# Patient Record
Sex: Female | Born: 1945 | Race: White | Hispanic: No | Smoking: Never smoker
Health system: Southern US, Community
[De-identification: ages and names within clinical notes are randomized; demographics above are authoritative.]

## PROBLEM LIST (undated history)

## (undated) DIAGNOSIS — M51369 Other intervertebral disc degeneration, lumbar region without mention of lumbar back pain or lower extremity pain: Secondary | ICD-10-CM

## (undated) DIAGNOSIS — M5136 Other intervertebral disc degeneration, lumbar region: Secondary | ICD-10-CM

## (undated) DIAGNOSIS — E785 Hyperlipidemia, unspecified: Secondary | ICD-10-CM

## (undated) DIAGNOSIS — I10 Essential (primary) hypertension: Secondary | ICD-10-CM

## (undated) DIAGNOSIS — T7840XA Allergy, unspecified, initial encounter: Secondary | ICD-10-CM

## (undated) DIAGNOSIS — M1990 Unspecified osteoarthritis, unspecified site: Secondary | ICD-10-CM

## (undated) DIAGNOSIS — M810 Age-related osteoporosis without current pathological fracture: Secondary | ICD-10-CM

## (undated) HISTORY — DX: Other intervertebral disc degeneration, lumbar region without mention of lumbar back pain or lower extremity pain: M51.369

## (undated) HISTORY — PX: CATARACT EXTRACTION, BILATERAL: SHX1313

## (undated) HISTORY — DX: Unspecified osteoarthritis, unspecified site: M19.90

## (undated) HISTORY — DX: Age-related osteoporosis without current pathological fracture: M81.0

## (undated) HISTORY — DX: Hyperlipidemia, unspecified: E78.5

## (undated) HISTORY — DX: Allergy, unspecified, initial encounter: T78.40XA

## (undated) HISTORY — DX: Essential (primary) hypertension: I10

## (undated) HISTORY — DX: Other intervertebral disc degeneration, lumbar region: M51.36

---

## 2007-10-16 ENCOUNTER — Ambulatory Visit (HOSPITAL_COMMUNITY): Admission: RE | Admit: 2007-10-16 | Discharge: 2007-10-16 | Payer: Self-pay | Admitting: Ophthalmology

## 2007-12-18 ENCOUNTER — Ambulatory Visit (HOSPITAL_COMMUNITY): Admission: RE | Admit: 2007-12-18 | Discharge: 2007-12-18 | Payer: Self-pay | Admitting: Ophthalmology

## 2011-02-14 LAB — BASIC METABOLIC PANEL
CO2: 29
Calcium: 9.8
Creatinine, Ser: 0.74
GFR calc Af Amer: >60
Glucose, Bld: 106 — ABNORMAL HIGH

## 2011-02-16 LAB — BASIC METABOLIC PANEL
BUN: 7
CO2: 29
Chloride: 103
Creatinine, Ser: 0.61
Glucose, Bld: 104 — ABNORMAL HIGH

## 2016-03-09 ENCOUNTER — Encounter: Payer: Self-pay | Admitting: Physician Assistant

## 2016-03-09 ENCOUNTER — Ambulatory Visit (INDEPENDENT_AMBULATORY_CARE_PROVIDER_SITE_OTHER): Payer: Medicare Other | Admitting: Physician Assistant

## 2016-03-09 VITALS — BP 136/76 | HR 64 | Temp 96.5°F | Ht 62.0 in | Wt 127.8 lb

## 2016-03-09 DIAGNOSIS — L659 Nonscarring hair loss, unspecified: Secondary | ICD-10-CM

## 2016-03-09 DIAGNOSIS — Z1159 Encounter for screening for other viral diseases: Secondary | ICD-10-CM

## 2016-03-09 DIAGNOSIS — Z1212 Encounter for screening for malignant neoplasm of rectum: Secondary | ICD-10-CM

## 2016-03-09 DIAGNOSIS — I10 Essential (primary) hypertension: Secondary | ICD-10-CM

## 2016-03-09 DIAGNOSIS — E782 Mixed hyperlipidemia: Secondary | ICD-10-CM | POA: Diagnosis not present

## 2016-03-09 DIAGNOSIS — Z23 Encounter for immunization: Secondary | ICD-10-CM

## 2016-03-09 DIAGNOSIS — R5383 Other fatigue: Secondary | ICD-10-CM | POA: Diagnosis not present

## 2016-03-09 MED ORDER — LISINOPRIL 10 MG PO TABS: 10.0000 mg | ORAL_TABLET | Freq: Every day | ORAL | 3 refills | Status: DC

## 2016-03-09 MED ORDER — PRAVASTATIN SODIUM 20 MG PO TABS: 20.0000 mg | ORAL_TABLET | Freq: Every day | ORAL | 3 refills | Status: DC

## 2016-03-09 NOTE — Patient Instructions (Signed)

## 2016-03-10 LAB — CMP14+EGFR
ALBUMIN: 4.2 g/dL (ref 3.5–4.8)
ALT: 8 IU/L (ref 0–32)
AST: 27 IU/L (ref 0–40)
Albumin/Globulin Ratio: 1.6 (ref 1.2–2.2)
Alkaline Phosphatase: 74 IU/L (ref 39–117)
BUN / CREAT RATIO: 13 (ref 12–28)
BUN: 9 mg/dL (ref 8–27)
Bilirubin Total: 0.7 mg/dL (ref 0.0–1.2)
CALCIUM: 9.7 mg/dL (ref 8.7–10.3)
CHLORIDE: 101 mmol/L (ref 96–106)
CO2: 24 mmol/L (ref 18–29)
CREATININE: 0.7 mg/dL (ref 0.57–1.00)
GFR, EST AFRICAN AMERICAN: 100 mL/min/{1.73_m2} (ref >59)
GFR, EST NON AFRICAN AMERICAN: 87 mL/min/{1.73_m2} (ref >59)
GLUCOSE: 91 mg/dL (ref 65–99)
Globulin, Total: 2.7 g/dL (ref 1.5–4.5)
Potassium: 4.7 mmol/L (ref 3.5–5.2)
Sodium: 144 mmol/L (ref 134–144)
TOTAL PROTEIN: 6.9 g/dL (ref 6.0–8.5)

## 2016-03-10 LAB — LIPID PANEL
Chol/HDL Ratio: 2.9 ratio units (ref 0.0–4.4)
Cholesterol, Total: 182 mg/dL (ref 100–199)
HDL: 63 mg/dL (ref >39)
LDL Calculated: 100 mg/dL — ABNORMAL HIGH (ref 0–99)
TRIGLYCERIDES: 97 mg/dL (ref 0–149)
VLDL Cholesterol Cal: 19 mg/dL (ref 5–40)

## 2016-03-10 LAB — HEPATITIS C ANTIBODY: Hep C Virus Ab: <0.1 s/co ratio (ref 0.0–0.9)

## 2016-03-10 LAB — TSH: TSH: 4.29 u[IU]/mL (ref 0.450–4.500)

## 2016-03-12 ENCOUNTER — Telehealth: Payer: Self-pay | Admitting: Physician Assistant

## 2016-03-12 NOTE — Progress Notes (Signed)
BP 136/76   Pulse 64   Temp (!) 96.5 F (35.8 C) (Oral)   Ht 5' 2"  (1.575 m)   Wt 127 lb 12.8 oz (58 kg)   BMI 23.37 kg/m    Subjective:    Patient ID: Ann Bolton, female    DOB: 02-26-1946, 70 y.o.   MRN: [id number]  Ann Bolton is a 70 y.o. female presenting on 03/09/2016 for Follow-up  HPI Patient here to be established as new patient at [place].  This patient is known to me from [facility]. She is having no new complaints at this time. 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 Some left shoulder pain and stiffness that she works out with some physical therapy. Updates on all of her health maintenance or performed today.  Relevant past medical, surgical, family and social history reviewed and updated as indicated. Interim medical history since our last visit reviewed. Allergies and medications reviewed and updated.   Data reviewed from any sources in EPIC.  Review of Systems  Constitutional: Negative.  Negative for activity change, fatigue and fever.  HENT: Negative.   Eyes: Negative.   Respiratory: Negative.  Negative for cough.   Cardiovascular: Negative.  Negative for chest pain.  Gastrointestinal: Negative.  Negative for abdominal pain.  Endocrine: Negative.   Genitourinary: Negative.  Negative for dysuria.  Musculoskeletal: Positive for arthralgias. Negative for back pain.  Skin: Negative.   Neurological: Negative.      Social History   Social History  . Marital status: Widowed    Spouse name: N/A  . Number of children: N/A  . Years of education: N/A   Occupational History  . Not on file.   Social History Main Topics  . Smoking status: Never Smoker  . Smokeless tobacco: Never Used  . Alcohol use Yes     Comment: occ  . Drug use: No  . Sexual activity: Not on file   Other Topics Concern  . Not on file   Social History Narrative  . No narrative on file    History reviewed. No pertinent surgical  history.  Family History  Problem Relation Age of Onset  . Cancer Mother     Brain   . Hypertension Mother   . Heart disease Father       Medication List       Accurate as of 03/09/16 11:59 PM. Always use your most recent med list.          Calcium 600-200 MG-UNIT tablet Take 1 tablet by mouth daily.   cholecalciferol 1000 units tablet Commonly known as:  VITAMIN D Take 1,000 Units by mouth daily.   co-enzyme Q-10 30 MG capsule Take 30 mg by mouth 3 (three) times daily.   lisinopril 10 MG tablet Commonly known as:  PRINIVIL,ZESTRIL Take 1 tablet (10 mg total) by mouth daily. Fill 90 day supply   multivitamin-iron-minerals-folic acid chewable tablet Chew 1 tablet by mouth daily.   pravastatin 20 MG tablet Commonly known as:  PRAVACHOL Take 1 tablet (20 mg total) by mouth daily. Fill 90 day supplies   vitamin C 500 MG tablet Commonly known as:  ASCORBIC ACID Take 500 mg by mouth daily.          Objective:    BP 136/76   Pulse 64   Temp (!) 96.5 F (35.8 C) (Oral)   Ht 5' 2"  (1.575 m)   Wt 127 lb 12.8 oz (58 kg)  BMI 23.37 kg/m   No Known Allergies Wt Readings from Last 3 Encounters:  03/09/16 127 lb 12.8 oz (58 kg)    Physical Exam  Constitutional: She is oriented to person, place, and time. She appears well-developed and well-nourished.  HENT:  Head: Normocephalic and atraumatic.  Eyes: Conjunctivae and EOM are normal. Pupils are equal, round, and reactive to light.  Neck: Normal range of motion. Neck supple.  Cardiovascular: Normal rate, regular rhythm, normal heart sounds and intact distal pulses.   Pulmonary/Chest: Effort normal and breath sounds normal.  Abdominal: Soft. Bowel sounds are normal.  Neurological: She is alert and oriented to person, place, and time. She has normal reflexes.  Skin: Skin is warm and dry. No rash noted.  Psychiatric: She has a normal mood and affect. Her behavior is normal. Judgment and thought content normal.     Results for orders placed or performed in visit on 03/09/16  Hepatitis C antibody  Result Value Ref Range   Hep C Virus Ab <0.1 0.0 - 0.9 s/co ratio  CMP14+EGFR  Result Value Ref Range   Glucose 91 65 - 99 mg/dL   BUN 9 8 - 27 mg/dL   Creatinine, Ser 0.70 0.57 - 1.00 mg/dL   GFR calc non Af Amer 87 >59 mL/min/1.73   GFR calc Af Amer 100 >59 mL/min/1.73   BUN/Creatinine Ratio 13 12 - 28   Sodium 144 134 - 144 mmol/L   Potassium 4.7 3.5 - 5.2 mmol/L   Chloride 101 96 - 106 mmol/L   CO2 24 18 - 29 mmol/L   Calcium 9.7 8.7 - 10.3 mg/dL   Total Protein 6.9 6.0 - 8.5 g/dL   Albumin 4.2 3.5 - 4.8 g/dL   Globulin, Total 2.7 1.5 - 4.5 g/dL   Albumin/Globulin Ratio 1.6 1.2 - 2.2   Bilirubin Total 0.7 0.0 - 1.2 mg/dL   Alkaline Phosphatase 74 39 - 117 IU/L   AST 27 0 - 40 IU/L   ALT 8 0 - 32 IU/L  Lipid panel  Result Value Ref Range   Cholesterol, Total 182 100 - 199 mg/dL   Triglycerides 97 0 - 149 mg/dL   HDL 63 >39 mg/dL   VLDL Cholesterol Cal 19 5 - 40 mg/dL   LDL Calculated 100 (H) 0 - 99 mg/dL   Chol/HDL Ratio 2.9 0.0 - 4.4 ratio units  TSH  Result Value Ref Range   TSH 4.290 0.450 - 4.500 uIU/mL      Assessment & Plan:   1. Mixed hyperlipidemia - pravastatin (PRAVACHOL) 20 MG tablet; Take 1 tablet (20 mg total) by mouth daily. Fill 90 day supplies  Dispense: 90 tablet; Refill: 3 - CMP14+EGFR - Lipid panel  2. Essential hypertension - lisinopril (PRINIVIL,ZESTRIL) 10 MG tablet; Take 1 tablet (10 mg total) by mouth daily. Fill 90 day supply  Dispense: 90 tablet; Refill: 3 - CMP14+EGFR  3. Hair loss - TSH  4. Fatigue, unspecified type - TSH  5. Need for hepatitis C screening test - Hepatitis C antibody  6. Screening for malignant neoplasm of the rectum - Fecal occult blood, imunochemical; Future  7. Encounter for immunization - Flu vaccine HIGH DOSE PF   Continue all other maintenance medications as listed above. Educational handout given for shoulder  pain  Follow up plan: Return in about 1 year (around 03/09/2017) for well adult.  Terald Sleeper PA-C [facility] [address]  [place], [place] [address] [phone]   03/12/2016,  7:45 AM

## 2016-03-12 NOTE — Telephone Encounter (Signed)
Pt aware of lab results 

## 2016-03-14 ENCOUNTER — Encounter: Payer: Self-pay | Admitting: Physician Assistant

## 2016-03-14 ENCOUNTER — Ambulatory Visit (INDEPENDENT_AMBULATORY_CARE_PROVIDER_SITE_OTHER): Payer: Medicare Other | Admitting: Physician Assistant

## 2016-03-14 VITALS — BP 120/70 | HR 80 | Temp 97.3°F | Ht 62.0 in | Wt 129.0 lb

## 2016-03-14 DIAGNOSIS — Z1212 Encounter for screening for malignant neoplasm of rectum: Secondary | ICD-10-CM

## 2016-03-14 DIAGNOSIS — R300 Dysuria: Secondary | ICD-10-CM

## 2016-03-14 DIAGNOSIS — N3001 Acute cystitis with hematuria: Secondary | ICD-10-CM

## 2016-03-14 LAB — URINALYSIS, COMPLETE
BILIRUBIN UA: NEGATIVE
Glucose, UA: NEGATIVE
Ketones, UA: NEGATIVE
Nitrite, UA: NEGATIVE
PH UA: 7 (ref 5.0–7.5)
Protein, UA: NEGATIVE
Specific Gravity, UA: 1.01 (ref 1.005–1.030)
UUROB: 0.2 mg/dL (ref 0.2–1.0)

## 2016-03-14 LAB — MICROSCOPIC EXAMINATION

## 2016-03-14 MED ORDER — SULFAMETHOXAZOLE-TRIMETHOPRIM 800-160 MG PO TABS: 1.0000 | ORAL_TABLET | Freq: Two times a day (BID) | ORAL | 0 refills | Status: DC

## 2016-03-14 NOTE — Progress Notes (Signed)
BP 120/70   Pulse 80   Temp 97.3 F (36.3 C) (Oral)   Ht 5\' 2"  (1.575 m)   Wt 129 lb (58.5 kg)   BMI 23.59 kg/m    Subjective:    Patient ID: Ann Bolton, female    DOB: 06/01/1945, 70 y.o.   MRN: [id number]  HPI: Ann Bolton is a 70 y.o. female presenting on 03/14/2016 for Dysuria  Dysuria, suprapubic pain, frequency with minimal output for the past week. Denies fever and chills, has been greater than 50 years since last UTI.  Relevant past medical, surgical, family and social history reviewed and updated as indicated. Allergies and medications reviewed and updated.  Past Medical History:  Diagnosis Date  . Hyperlipidemia   . Hypertension     No past surgical history on file.  Review of Systems  Constitutional: Negative.   HENT: Negative.   Eyes: Negative.   Respiratory: Negative.   Gastrointestinal: Negative.   Genitourinary: Positive for difficulty urinating, dysuria, frequency and urgency. Negative for flank pain and hematuria.      Medication List       Accurate as of 03/14/16  5:09 PM. Always use your most recent med list.          Calcium 600-200 MG-UNIT tablet Take 1 tablet by mouth daily.   cholecalciferol 1000 units tablet Commonly known as:  VITAMIN D Take 1,000 Units by mouth daily.   co-enzyme Q-10 30 MG capsule Take 30 mg by mouth 3 (three) times daily.   lisinopril 10 MG tablet Commonly known as:  PRINIVIL,ZESTRIL Take 1 tablet (10 mg total) by mouth daily. Fill 90 day supply   multivitamin-iron-minerals-folic acid chewable tablet Chew 1 tablet by mouth daily.   pravastatin 20 MG tablet Commonly known as:  PRAVACHOL Take 1 tablet (20 mg total) by mouth daily. Fill 90 day supplies   sulfamethoxazole-trimethoprim 800-160 MG tablet Commonly known as:  BACTRIM DS Take 1 tablet by mouth 2 (two) times daily.   vitamin C 500 MG tablet Commonly known as:  ASCORBIC ACID Take 500 mg by mouth daily.          Objective:    BP  120/70   Pulse 80   Temp 97.3 F (36.3 C) (Oral)   Ht 5\' 2"  (1.575 m)   Wt 129 lb (58.5 kg)   BMI 23.59 kg/m   No Known Allergies  Physical Exam  Constitutional: She is oriented to person, place, and time. She appears well-developed and well-nourished.  HENT:  Head: Normocephalic and atraumatic.  Eyes: Conjunctivae are normal. Pupils are equal, round, and reactive to light.  Cardiovascular: Normal rate, regular rhythm, normal heart sounds and intact distal pulses.   Pulmonary/Chest: Effort normal and breath sounds normal.  Abdominal: Soft. Bowel sounds are normal. She exhibits no distension and no mass. There is tenderness in the suprapubic area. There is no rebound, no guarding and no CVA tenderness.  Neurological: She is alert and oriented to person, place, and time. She has normal reflexes.  Skin: Skin is warm and dry. No rash noted.  Psychiatric: She has a normal mood and affect. Her behavior is normal. Judgment and thought content normal.    Results for orders placed or performed in visit on 03/14/16  Microscopic Examination  Result Value Ref Range   WBC, UA 11-30 (A) 0 - 5 /hpf   RBC, UA 0-2 0 - 2 /hpf   Epithelial Cells (non renal) 0-10 0 - 10 /hpf  Bacteria, UA Few None seen/Few  Urinalysis, Complete  Result Value Ref Range   Specific Gravity, UA 1.010 1.005 - 1.030   pH, UA 7.0 5.0 - 7.5   Color, UA Yellow Yellow   Appearance Ur Clear Clear   Leukocytes, UA Trace (A) Negative   Protein, UA Negative Negative/Trace   Glucose, UA Negative Negative   Ketones, UA Negative Negative   RBC, UA Trace (A) Negative   Bilirubin, UA Negative Negative   Urobilinogen, Ur 0.2 0.2 - 1.0 mg/dL   Nitrite, UA Negative Negative   Microscopic Examination See below:       Assessment & Plan:   1. Dysuria - Urinalysis, Complete  2. Acute cystitis with hematuria - sulfamethoxazole-trimethoprim (BACTRIM DS) 800-160 MG tablet; Take 1 tablet by mouth 2 (two) times daily.  Dispense:  14 tablet; Refill: 0  3. Screening for malignant neoplasm of the rectum - Fecal occult blood, imunochemical   Continue all other maintenance medications as listed above.  Follow up plan: Return if symptoms worsen or fail to improve.  Orders Placed This Encounter  Procedures  . Microscopic Examination  . Urinalysis, Complete    Educational handout given for uti  Remus Loffler PA-C [facility] [address]  [place], [place] [address] [phone]   03/14/2016, 5:09 PM

## 2016-03-14 NOTE — Patient Instructions (Signed)

## 2016-03-15 LAB — FECAL OCCULT BLOOD, IMMUNOCHEMICAL: FECAL OCCULT BLD: NEGATIVE

## 2016-10-23 ENCOUNTER — Telehealth: Payer: Self-pay | Admitting: Physician Assistant

## 2016-10-23 NOTE — Telephone Encounter (Signed)
Scheduled

## 2016-12-06 ENCOUNTER — Telehealth: Payer: Self-pay | Admitting: Physician Assistant

## 2016-12-06 NOTE — Telephone Encounter (Signed)
Scheduled on [facility] 8/10

## 2017-01-23 ENCOUNTER — Ambulatory Visit (INDEPENDENT_AMBULATORY_CARE_PROVIDER_SITE_OTHER): Payer: Medicare Other | Admitting: Physician Assistant

## 2017-01-23 ENCOUNTER — Encounter: Payer: Self-pay | Admitting: Physician Assistant

## 2017-01-23 VITALS — BP 132/78 | HR 73 | Temp 97.3°F | Ht 62.0 in | Wt 127.8 lb

## 2017-01-23 DIAGNOSIS — M791 Myalgia, unspecified site: Secondary | ICD-10-CM

## 2017-01-23 DIAGNOSIS — E7800 Pure hypercholesterolemia, unspecified: Secondary | ICD-10-CM | POA: Insufficient documentation

## 2017-01-23 DIAGNOSIS — R5382 Chronic fatigue, unspecified: Secondary | ICD-10-CM

## 2017-01-23 DIAGNOSIS — I10 Essential (primary) hypertension: Secondary | ICD-10-CM | POA: Insufficient documentation

## 2017-01-23 DIAGNOSIS — E782 Mixed hyperlipidemia: Secondary | ICD-10-CM

## 2017-01-23 DIAGNOSIS — Z01419 Encounter for gynecological examination (general) (routine) without abnormal findings: Secondary | ICD-10-CM | POA: Diagnosis not present

## 2017-01-23 MED ORDER — PRAVASTATIN SODIUM 20 MG PO TABS: 20.0000 mg | ORAL_TABLET | Freq: Every day | ORAL | 3 refills | Status: DC

## 2017-01-23 MED ORDER — LISINOPRIL 10 MG PO TABS: 10.0000 mg | ORAL_TABLET | Freq: Every day | ORAL | 3 refills | Status: DC

## 2017-01-23 NOTE — Progress Notes (Signed)
BP 132/78   Pulse 73   Temp (!) 97.3 F (36.3 C) (Oral)   Ht '5\' 2"'$  (1.575 m)   Wt 127 lb 12.8 oz (58 kg)   BMI 23.37 kg/m    Subjective:    Patient ID: Ann Bolton, female    DOB: 03-01-1946, 71 y.o.   MRN: [id number]  HPI: Ann Bolton is a 71 y.o. female presenting on 01/23/2017 for No chief complaint on file.  Ann Bolton comes in for annual well physical examination. All medications are reviewed today. There are no reports of any problems with the medications. All of the medical conditions are reviewed and updated.  Lab work is reviewed and will be ordered as medically necessary. She reports that overall she is doing pretty well. However there are times that last 2-3 weeks she will have multiple joints that hurt. There is no associated swelling or erythema. She does not know of any family history of rheumatoid or lupus. She has a daughter with hypothyroidism. She states this is been going on for the past 6 months. She does have exposure to ticks in the past. She is an avid outdoorsman.  Relevant past medical, surgical, family and social history reviewed and updated as indicated. Allergies and medications reviewed and updated.  Past Medical History:  Diagnosis Date  . Hyperlipidemia   . Hypertension     History reviewed. No pertinent surgical history.  Review of Systems  Constitutional: Positive for fatigue. Negative for activity change and fever.  HENT: Negative.   Eyes: Negative.   Respiratory: Negative.  Negative for cough.   Cardiovascular: Negative.  Negative for chest pain.  Gastrointestinal: Negative.  Negative for abdominal pain.  Endocrine: Negative.   Genitourinary: Negative.  Negative for dysuria.  Musculoskeletal: Positive for arthralgias and myalgias.  Skin: Negative.   Neurological: Negative.     Allergies as of 01/23/2017   No Known Allergies     Medication List       Accurate as of 01/23/17 11:43 AM. Always use your most recent med list.            Calcium 600-200 MG-UNIT tablet Take 1 tablet by mouth daily.   cholecalciferol 1000 units tablet Commonly known as:  VITAMIN D Take 1,000 Units by mouth daily.   co-enzyme Q-10 30 MG capsule Take 30 mg by mouth 3 (three) times daily.   lisinopril 10 MG tablet Commonly known as:  PRINIVIL,ZESTRIL Take 1 tablet (10 mg total) by mouth daily. Fill 90 day supply   multivitamin-iron-minerals-folic acid chewable tablet Chew 1 tablet by mouth daily.   pravastatin 20 MG tablet Commonly known as:  PRAVACHOL Take 1 tablet (20 mg total) by mouth daily. Fill 90 day supplies   vitamin C 500 MG tablet Commonly known as:  ASCORBIC ACID Take 500 mg by mouth daily.   XIIDRA 5 % Soln Generic drug:  [employer]itegr[employer]            Discharge Care Instructions        Start     Ordered   01/23/17 0000  CBC with Differential/Platelet     01/23/17 1052   01/23/17 0000  CMP14+EGFR     01/23/17 1052   01/23/17 0000  Lipid panel     01/23/17 1052   01/23/17 0000  Thyroid Panel With TSH     01/23/17 1052   01/23/17 0000  Lyme Ab/Western Blot Reflex     01/23/17 1052   01/23/17 0000  pravastatin (  PRAVACHOL) 20 MG tablet  Daily    Question:  Supervising Provider  Answer:  Timmothy Euler   01/23/17 1102   01/23/17 0000  lisinopril (PRINIVIL,ZESTRIL) 10 MG tablet  Daily    Question:  Supervising Provider  Answer:  Timmothy Euler   01/23/17 1102         Objective:    BP 132/78   Pulse 73   Temp (!) 97.3 F (36.3 C) (Oral)   Ht _0  (1.575 m)   Wt 127 lb 12.8 oz (58 kg)   BMI 23.37 kg/m   No Known Allergies  Physical Exam  Constitutional: She is oriented to person, place, and time. She appears well-developed and well-nourished.  HENT:  Head: Normocephalic and atraumatic.  Eyes: Pupils are equal, round, and reactive to light. Conjunctivae and EOM are normal.  Neck: Normal range of motion. Neck supple.  Cardiovascular: Normal rate, regular rhythm, normal heart sounds and  intact distal pulses.   Pulmonary/Chest: Effort normal and breath sounds normal. Right breast exhibits no mass, no skin change and no tenderness. Left breast exhibits no mass, no skin change and no tenderness. Breasts are symmetrical.  Abdominal: Soft. Bowel sounds are normal.  Genitourinary: Vagina normal and uterus normal. Rectal exam shows no fissure. No breast swelling, tenderness, discharge or bleeding. There is no tenderness or lesion on the right labia. There is no tenderness or lesion on the left labia. Uterus is not deviated, not enlarged and not tender. Cervix exhibits no motion tenderness, no discharge and no friability. Right adnexum displays no mass, no tenderness and no fullness. Left adnexum displays no mass, no tenderness and no fullness. No tenderness or bleeding in the vagina. No vaginal discharge found.  Neurological: She is alert and oriented to person, place, and time. She has normal reflexes.  Skin: Skin is warm and dry. No rash noted.  Psychiatric: She has a normal mood and affect. Her behavior is normal. Judgment and thought content normal.        Assessment & Plan:   1. Well female exam with routine gynecological exam - CBC with Differential/Platelet - CMP14+EGFR - Lipid panel - Thyroid Panel With TSH  2. Essential hypertension - lisinopril (PRINIVIL,ZESTRIL) 10 MG tablet; Take 1 tablet (10 mg total) by mouth daily. Fill 90 day supply  Dispense: 90 tablet; Refill: 3  3. Pure hypercholesterolemia - Lipid panel  4. Chronic fatigue - Thyroid Panel With TSH - Lyme Ab/Western Blot Reflex  5. Myalgia - Thyroid Panel With TSH - Lyme Ab/Western Blot Reflex  6. Mixed hyperlipidemia - pravastatin (PRAVACHOL) 20 MG tablet; Take 1 tablet (20 mg total) by mouth daily. Fill 90 day supplies  Dispense: 90 tablet; Refill: 3    Current Outpatient Prescriptions:  .  Calcium 600-200 MG-UNIT tablet, Take 1 tablet by mouth daily., Disp: , Rfl:  .  cholecalciferol (VITAMIN  D) 1000 units tablet, Take 1,000 Units by mouth daily., Disp: , Rfl:  .  co-enzyme Q-10 30 MG capsule, Take 30 mg by mouth 3 (three) times daily., Disp: , Rfl:  .  lisinopril (PRINIVIL,ZESTRIL) 10 MG tablet, Take 1 tablet (10 mg total) by mouth daily. Fill 90 day supply, Disp: 90 tablet, Rfl: 3 .  multivitamin-iron-minerals-folic acid (CENTRUM) chewable tablet, Chew 1 tablet by mouth daily., Disp: , Rfl:  .  pravastatin (PRAVACHOL) 20 MG tablet, Take 1 tablet (20 mg total) by mouth daily. Fill 90 day supplies, Disp: 90 tablet, Rfl: 3 .  vitamin C (  ASCORBIC ACID) 500 MG tablet, Take 500 mg by mouth daily., Disp: , Rfl:  .  XIIDRA 5 % SOLN, , Disp: , Rfl:  Continue all other maintenance medications as listed above.  Follow up plan: Return in about 1 year (around 01/23/2018) for well.  Educational handout given for [place] PA-C [facility] [address]  [place], [place] [address] [phone]   01/23/2017, 11:43 AM

## 2017-01-23 NOTE — Patient Instructions (Signed)
In a few days you may receive a survey in the mail or online from [employer] regarding your visit with us today. Please take a moment to fill this out. Your feedback is very important to our whole office. It can help us better understand your needs as well as improve your experience and satisfaction. Thank you for taking your time to complete it. We care about you.  Neziah Vogelgesang, PA-C  

## 2017-01-24 LAB — LYME AB/WESTERN BLOT REFLEX
LYME DISEASE AB, QUANT, IGM: <0.8 index (ref 0.00–0.79)
Lyme IgG/IgM Ab: <0.91 {ISR} (ref 0.00–0.90)

## 2017-01-24 LAB — LIPID PANEL
CHOL/HDL RATIO: 2.6 ratio (ref 0.0–4.4)
Cholesterol, Total: 155 mg/dL (ref 100–199)
HDL: 60 mg/dL (ref >39)
LDL CALC: 78 mg/dL (ref 0–99)
TRIGLYCERIDES: 84 mg/dL (ref 0–149)
VLDL CHOLESTEROL CAL: 17 mg/dL (ref 5–40)

## 2017-01-24 LAB — CMP14+EGFR
ALT: 6 IU/L (ref 0–32)
AST: 25 IU/L (ref 0–40)
Albumin/Globulin Ratio: 2 (ref 1.2–2.2)
Albumin: 4.4 g/dL (ref 3.5–4.8)
Alkaline Phosphatase: 69 IU/L (ref 39–117)
BUN/Creatinine Ratio: 17 (ref 12–28)
BUN: 10 mg/dL (ref 8–27)
Bilirubin Total: 0.5 mg/dL (ref 0.0–1.2)
CALCIUM: 9.2 mg/dL (ref 8.7–10.3)
CO2: 23 mmol/L (ref 20–29)
CREATININE: 0.6 mg/dL (ref 0.57–1.00)
Chloride: 104 mmol/L (ref 96–106)
GFR, EST AFRICAN AMERICAN: 105 mL/min/{1.73_m2} (ref >59)
GFR, EST NON AFRICAN AMERICAN: 91 mL/min/{1.73_m2} (ref >59)
GLUCOSE: 82 mg/dL (ref 65–99)
Globulin, Total: 2.2 g/dL (ref 1.5–4.5)
Potassium: 4.4 mmol/L (ref 3.5–5.2)
Sodium: 143 mmol/L (ref 134–144)
TOTAL PROTEIN: 6.6 g/dL (ref 6.0–8.5)

## 2017-01-24 LAB — CBC WITH DIFFERENTIAL/PLATELET
BASOS ABS: 0 10*3/uL (ref 0.0–0.2)
Basos: 1 %
EOS (ABSOLUTE): 0 10*3/uL (ref 0.0–0.4)
Eos: 0 %
Hematocrit: 41.6 % (ref 34.0–46.6)
Hemoglobin: 14.3 g/dL (ref 11.1–15.9)
IMMATURE GRANS (ABS): 0 10*3/uL (ref 0.0–0.1)
IMMATURE GRANULOCYTES: 0 %
LYMPHS: 34 %
Lymphocytes Absolute: 1.7 10*3/uL (ref 0.7–3.1)
MCH: 32.3 pg (ref 26.6–33.0)
MCHC: 34.4 g/dL (ref 31.5–35.7)
MCV: 94 fL (ref 79–97)
Monocytes Absolute: 0.3 10*3/uL (ref 0.1–0.9)
Monocytes: 6 %
NEUTROS PCT: 59 %
Neutrophils Absolute: 2.9 10*3/uL (ref 1.4–7.0)
PLATELETS: 201 10*3/uL (ref 150–379)
RBC: 4.43 x10E6/uL (ref 3.77–5.28)
RDW: 13.6 % (ref 12.3–15.4)
WBC: 4.9 10*3/uL (ref 3.4–10.8)

## 2017-01-24 LAB — THYROID PANEL WITH TSH
FREE THYROXINE INDEX: 1.9 (ref 1.2–4.9)
T3 Uptake Ratio: 25 % (ref 24–39)
T4, Total: 7.7 ug/dL (ref 4.5–12.0)
TSH: 4.31 u[IU]/mL (ref 0.450–4.500)

## 2017-08-27 ENCOUNTER — Encounter: Payer: Self-pay | Admitting: *Deleted

## 2017-10-07 ENCOUNTER — Encounter: Payer: Self-pay | Admitting: *Deleted

## 2017-12-23 ENCOUNTER — Encounter: Payer: Self-pay | Admitting: Physician Assistant

## 2018-01-24 ENCOUNTER — Other Ambulatory Visit: Payer: Medicare Other | Admitting: Physician Assistant

## 2018-01-27 ENCOUNTER — Encounter: Payer: Self-pay | Admitting: Physician Assistant

## 2018-01-27 ENCOUNTER — Other Ambulatory Visit: Payer: Self-pay | Admitting: Physician Assistant

## 2018-01-27 ENCOUNTER — Ambulatory Visit (INDEPENDENT_AMBULATORY_CARE_PROVIDER_SITE_OTHER): Payer: Medicare Other | Admitting: Physician Assistant

## 2018-01-27 ENCOUNTER — Ambulatory Visit (INDEPENDENT_AMBULATORY_CARE_PROVIDER_SITE_OTHER): Payer: Medicare Other

## 2018-01-27 VITALS — BP 136/83 | HR 74 | Ht 62.0 in | Wt 128.8 lb

## 2018-01-27 DIAGNOSIS — Z0000 Encounter for general adult medical examination without abnormal findings: Secondary | ICD-10-CM

## 2018-01-27 DIAGNOSIS — I10 Essential (primary) hypertension: Secondary | ICD-10-CM

## 2018-01-27 DIAGNOSIS — G8929 Other chronic pain: Secondary | ICD-10-CM

## 2018-01-27 DIAGNOSIS — M545 Low back pain, unspecified: Secondary | ICD-10-CM

## 2018-01-27 DIAGNOSIS — E782 Mixed hyperlipidemia: Secondary | ICD-10-CM

## 2018-01-27 DIAGNOSIS — E7800 Pure hypercholesterolemia, unspecified: Secondary | ICD-10-CM

## 2018-01-27 NOTE — Progress Notes (Signed)
BP 136/83   Pulse 74   Ht 5' 2"  (1.575 m)   Wt 128 lb 12.8 oz (58.4 kg)   BMI 23.56 kg/m    Subjective:    Patient ID: Ann Bolton, female    DOB: [birth year], 72 y.o.   MRN: [id number]  HPI: Ann Bolton is a 72 y.o. female presenting on 01/27/2018 for Annual Exam This patient comes in for annual well physical examination. All medications are reviewed today. There are no reports of any problems with the medications. All of the medical conditions are reviewed and updated.  Lab work is reviewed and will be ordered as medically necessary. There are no new problems reported with today's visit.  Patient reports doing well overall.   Past Medical History:  Diagnosis Date  . Hyperlipidemia   . Hypertension    Relevant past medical, surgical, family and social history reviewed and updated as indicated. Interim medical history since our last visit reviewed. Allergies and medications reviewed and updated. DATA REVIEWED: CHART IN EPIC  Family History reviewed for pertinent findings.  Review of Systems  Constitutional: Negative.  Negative for activity change, fatigue and fever.  HENT: Negative.   Eyes: Negative.   Respiratory: Negative.  Negative for cough.   Cardiovascular: Negative.  Negative for chest pain.  Gastrointestinal: Negative.  Negative for abdominal pain.  Endocrine: Negative.   Genitourinary: Negative.  Negative for dysuria.  Musculoskeletal: Positive for arthralgias, back pain and myalgias.  Skin: Negative.   Neurological: Negative.     Allergies as of 01/27/2018   No Known Allergies     Medication List        Accurate as of 01/27/18  9:49 PM. Always use your most recent med list.          Calcium 600-200 MG-UNIT tablet Take 1 tablet by mouth daily.   cholecalciferol 1000 units tablet Commonly known as:  VITAMIN D Take 1,000 Units by mouth daily.   lisinopril 10 MG tablet Commonly known as:  PRINIVIL,ZESTRIL TAKE 1 TABLET BY MOUTH  DAILY.     multivitamin-iron-minerals-folic acid chewable tablet Chew 1 tablet by mouth daily.   pravastatin 20 MG tablet Commonly known as:  PRAVACHOL TAKE 1 TABLET BY MOUTH  DAILY.   vitamin C 500 MG tablet Commonly known as:  ASCORBIC ACID Take 500 mg by mouth daily.   XIIDRA 5 % Soln Generic drug:  Lifitegrast          Objective:    BP 136/83   Pulse 74   Ht 5' 2"  (1.575 m)   Wt 128 lb 12.8 oz (58.4 kg)   BMI 23.56 kg/m   No Known Allergies  Wt Readings from Last 3 Encounters:  01/27/18 128 lb 12.8 oz (58.4 kg)  01/23/17 127 lb 12.8 oz (58 kg)  03/14/16 129 lb (58.5 kg)    Physical Exam  Constitutional: She is oriented to person, place, and time. She appears well-developed and well-nourished.  HENT:  Head: Normocephalic and atraumatic.  Eyes: Pupils are equal, round, and reactive to light. Conjunctivae and EOM are normal.  Neck: Normal range of motion. Neck supple.  Cardiovascular: Normal rate, regular rhythm, normal heart sounds and intact distal pulses.  Pulmonary/Chest: Effort normal and breath sounds normal. Right breast exhibits no mass, no skin change and no tenderness. Left breast exhibits no mass, no skin change and no tenderness. No breast tenderness, discharge or bleeding. Breasts are symmetrical.  Abdominal: Soft. Bowel sounds are normal.  Genitourinary:  Vagina normal and uterus normal. Rectal exam shows no fissure. No breast tenderness, discharge or bleeding. There is no tenderness or lesion on the right labia. There is no tenderness or lesion on the left labia. Uterus is not deviated, not enlarged and not tender. Cervix exhibits no motion tenderness, no discharge and no friability. Right adnexum displays no mass, no tenderness and no fullness. Left adnexum displays no mass, no tenderness and no fullness. No tenderness or bleeding in the vagina. No vaginal discharge found.  Musculoskeletal:       Lumbar back: She exhibits decreased range of motion, tenderness, pain  and spasm.       Back:  Neurological: She is alert and oriented to person, place, and time. She has normal reflexes.  Skin: Skin is warm and dry. No rash noted.  Psychiatric: She has a normal mood and affect. Her behavior is normal. Judgment and thought content normal.    Results for orders placed or performed in visit on 01/23/17  CBC with Differential/Platelet  Result Value Ref Range   WBC 4.9 3.4 - 10.8 x10E3/uL   RBC 4.43 3.77 - 5.28 x10E6/uL   Hemoglobin 14.3 11.1 - 15.9 g/dL   Hematocrit 41.6 34.0 - 46.6 %   MCV 94 79 - 97 fL   MCH 32.3 26.6 - 33.0 pg   MCHC 34.4 31.5 - 35.7 g/dL   RDW 13.6 12.3 - 15.4 %   Platelets 201 150 - 379 x10E3/uL   Neutrophils 59 Not Estab. %   Lymphs 34 Not Estab. %   Monocytes 6 Not Estab. %   Eos 0 Not Estab. %   Basos 1 Not Estab. %   Neutrophils Absolute 2.9 1.4 - 7.0 x10E3/uL   Lymphocytes Absolute 1.7 0.7 - 3.1 x10E3/uL   Monocytes Absolute 0.3 0.1 - 0.9 x10E3/uL   EOS (ABSOLUTE) 0.0 0.0 - 0.4 x10E3/uL   Basophils Absolute 0.0 0.0 - 0.2 x10E3/uL   Immature Granulocytes 0 Not Estab. %   Immature Grans (Abs) 0.0 0.0 - 0.1 x10E3/uL  CMP14+EGFR  Result Value Ref Range   Glucose 82 65 - 99 mg/dL   BUN 10 8 - 27 mg/dL   Creatinine, Ser 0.60 0.57 - 1.00 mg/dL   GFR calc non Af Amer 91 >59 mL/min/1.73   GFR calc Af Amer 105 >59 mL/min/1.73   BUN/Creatinine Ratio 17 12 - 28   Sodium 143 134 - 144 mmol/L   Potassium 4.4 3.5 - 5.2 mmol/L   Chloride 104 96 - 106 mmol/L   CO2 23 20 - 29 mmol/L   Calcium 9.2 8.7 - 10.3 mg/dL   Total Protein 6.6 6.0 - 8.5 g/dL   Albumin 4.4 3.5 - 4.8 g/dL   Globulin, Total 2.2 1.5 - 4.5 g/dL   Albumin/Globulin Ratio 2.0 1.2 - 2.2   Bilirubin Total 0.5 0.0 - 1.2 mg/dL   Alkaline Phosphatase 69 39 - 117 IU/L   AST 25 0 - 40 IU/L   ALT 6 0 - 32 IU/L  Lipid panel  Result Value Ref Range   Cholesterol, Total 155 100 - 199 mg/dL   Triglycerides 84 0 - 149 mg/dL   HDL 60 >39 mg/dL   VLDL Cholesterol Cal 17 5  - 40 mg/dL   LDL Calculated 78 0 - 99 mg/dL   Chol/HDL Ratio 2.6 0.0 - 4.4 ratio  Thyroid Panel With TSH  Result Value Ref Range   TSH 4.310 0.450 - 4.500 uIU/mL   T4, Total 7.7  4.5 - 12.0 ug/dL   T3 Uptake Ratio 25 24 - 39 %   Free Thyroxine Index 1.9 1.2 - 4.9  Lyme Ab/Western Blot Reflex  Result Value Ref Range   Lyme IgG/IgM Ab <0.91 0.00 - 0.90 ISR   LYME DISEASE AB, QUANT, IGM <0.80 0.00 - 0.79 index      Assessment & Plan:   1. Well adult exam - DG Lumbar Spine 2-3 Views; Future - CMP14+EGFR; Future - CBC with Differential/Platelet; Future - Lipid panel; Future - TSH; Future  2. Chronic left-sided low back pain without sciatica - DG Lumbar Spine 2-3 Views; Future  3. Essential hypertension Continue medications  4. Pure hypercholesterolemia Continue medications   Continue all other maintenance medications as listed above.  Follow up plan: No follow-ups on file.  Educational handout given for Chancellor PA-C [place] [address]  [place], [place] [address] [phone]   01/27/2018, 9:49 PM

## 2018-01-28 ENCOUNTER — Ambulatory Visit: Payer: Medicare Other

## 2018-01-29 ENCOUNTER — Other Ambulatory Visit: Payer: Medicare Other

## 2018-01-29 NOTE — Addendum Note (Signed)
Addended by: Prescott Gum on: 01/29/2018 08:54 AM   Modules accepted: Orders

## 2018-01-30 LAB — CMP14+EGFR
ALT: 7 IU/L (ref 0–32)
AST: 26 IU/L (ref 0–40)
Albumin/Globulin Ratio: 2 (ref 1.2–2.2)
Albumin: 4.4 g/dL (ref 3.5–4.8)
Alkaline Phosphatase: 67 IU/L (ref 39–117)
BUN/Creatinine Ratio: 14 (ref 12–28)
BUN: 11 mg/dL (ref 8–27)
Bilirubin Total: 0.7 mg/dL (ref 0.0–1.2)
CALCIUM: 9.5 mg/dL (ref 8.7–10.3)
CO2: 26 mmol/L (ref 20–29)
CREATININE: 0.76 mg/dL (ref 0.57–1.00)
Chloride: 103 mmol/L (ref 96–106)
GFR calc Af Amer: 89 mL/min/{1.73_m2} (ref >59)
GFR, EST NON AFRICAN AMERICAN: 78 mL/min/{1.73_m2} (ref >59)
Globulin, Total: 2.2 g/dL (ref 1.5–4.5)
Glucose: 90 mg/dL (ref 65–99)
POTASSIUM: 4.5 mmol/L (ref 3.5–5.2)
Sodium: 143 mmol/L (ref 134–144)
Total Protein: 6.6 g/dL (ref 6.0–8.5)

## 2018-01-30 LAB — CBC WITH DIFFERENTIAL/PLATELET
Basophils Absolute: 0.1 10*3/uL (ref 0.0–0.2)
Basos: 1 %
EOS (ABSOLUTE): 0.1 10*3/uL (ref 0.0–0.4)
EOS: 1 %
Hematocrit: 43.2 % (ref 34.0–46.6)
Hemoglobin: 14.8 g/dL (ref 11.1–15.9)
IMMATURE GRANULOCYTES: 0 %
Immature Grans (Abs): 0 10*3/uL (ref 0.0–0.1)
Lymphocytes Absolute: 2 10*3/uL (ref 0.7–3.1)
Lymphs: 34 %
MCH: 31.9 pg (ref 26.6–33.0)
MCHC: 34.3 g/dL (ref 31.5–35.7)
MCV: 93 fL (ref 79–97)
MONOS ABS: 0.5 10*3/uL (ref 0.1–0.9)
Monocytes: 8 %
NEUTROS PCT: 56 %
Neutrophils Absolute: 3.3 10*3/uL (ref 1.4–7.0)
PLATELETS: 220 10*3/uL (ref 150–450)
RBC: 4.64 x10E6/uL (ref 3.77–5.28)
RDW: 12.9 % (ref 12.3–15.4)
WBC: 5.9 10*3/uL (ref 3.4–10.8)

## 2018-01-30 LAB — LIPID PANEL
Chol/HDL Ratio: 2.6 ratio (ref 0.0–4.4)
Cholesterol, Total: 175 mg/dL (ref 100–199)
HDL: 67 mg/dL (ref >39)
LDL CALC: 91 mg/dL (ref 0–99)
Triglycerides: 86 mg/dL (ref 0–149)
VLDL CHOLESTEROL CAL: 17 mg/dL (ref 5–40)

## 2018-01-30 LAB — TSH: TSH: 4.7 u[IU]/mL — AB (ref 0.450–4.500)

## 2018-02-03 ENCOUNTER — Other Ambulatory Visit: Payer: Self-pay | Admitting: *Deleted

## 2018-02-03 DIAGNOSIS — R7989 Other specified abnormal findings of blood chemistry: Secondary | ICD-10-CM

## 2018-04-15 LAB — HM MAMMOGRAPHY

## 2018-07-01 ENCOUNTER — Other Ambulatory Visit: Payer: Self-pay | Admitting: Physician Assistant

## 2018-07-01 DIAGNOSIS — I10 Essential (primary) hypertension: Secondary | ICD-10-CM

## 2018-07-01 DIAGNOSIS — E782 Mixed hyperlipidemia: Secondary | ICD-10-CM

## 2018-10-08 ENCOUNTER — Telehealth: Payer: Self-pay | Admitting: Physician Assistant

## 2018-10-17 ENCOUNTER — Ambulatory Visit (INDEPENDENT_AMBULATORY_CARE_PROVIDER_SITE_OTHER): Payer: Medicare Other | Admitting: *Deleted

## 2018-10-17 ENCOUNTER — Other Ambulatory Visit: Payer: Self-pay

## 2018-10-17 VITALS — BP 140/65 | HR 65 | Ht 62.0 in | Wt 130.0 lb

## 2018-10-17 DIAGNOSIS — Z0000 Encounter for general adult medical examination without abnormal findings: Secondary | ICD-10-CM | POA: Diagnosis not present

## 2018-10-17 NOTE — Patient Instructions (Signed)
  Ann Bolton , Thank you for taking time to come for your Medicare Wellness Visit. I appreciate your ongoing commitment to your health goals. Please review the following plan we discussed and let me know if I can assist you in the future.   These are the goals we discussed: Goals    . Prevent falls     Stay active        This is a list of the screening recommended for you and due dates:  Health Maintenance  Topic Date Due  . Tetanus Vaccine  08/24/1962  . Colon Cancer Screening  08/23/1993  . DEXA scan (bone density measurement)  08/23/2008  . Flu Shot  12/20/2018  .  Hepatitis C: One time screening is recommended by Center for Disease Control  (CDC) for  adults born from [birth year] through 1967.   Completed  . Pneumonia vaccines  Completed

## 2018-10-17 NOTE — Progress Notes (Signed)
MEDICARE ANNUAL WELLNESS VISIT  10/17/2018  Telephone Visit Disclaimer This Medicare AWV was conducted by telephone due to national recommendations for restrictions regarding the COVID-19 Pandemic (e.g. social distancing).  I verified, using two identifiers, that I am speaking with Ann GreenhouseSherry S Ken or their authorized healthcare agent. I discussed the limitations, risks, security, and privacy concerns of performing an evaluation and management service by telephone and the potential availability of an in-person appointment in the future. The patient expressed understanding and agreed to proceed.   Subjective:  Ann Bolton is a 73 y.o. female patient of Remus LofflerJones, Angel S, PA-C who had a Medicare Annual Wellness Visit today via telephone. Ann PenSherry is Working part time and lives alone. she has 3 children. she reports that she is socially active and does interact with friends/family regularly. she is markedly physically active and enjoys reading.  Patient Care Team: Caryl NeverJones, Angel S, PA-C as PCP - General (Physician Assistant)  Advanced Directives 10/17/2018  Does Patient Have a Medical Advance Directive? Yes  Type of Estate agentAdvance Directive Healthcare Power of [place]orney;Living will  Does patient want to make changes to medical advance directive? No - Patient declined  Copy of Healthcare Power of Attorney in Chart? No - copy requested    Hospital Utilization Over the Past 12 Months: # of hospitalizations or ER visits: 0 # of surgeries: 0    Review of Systems    Patient reports that her overall health is unchanged compared to last year.  Patient Reported Readings (BP, Pulse, CBG, Weight, etc) BP 140/65   Pulse 65   Ht 5\' 2"  (1.575 m)   Wt 130 lb (59 kg)   BMI 23.78 kg/m    Review of Systems: General ROS: negative  All other systems negative.  Pain Assessment       Current Medications & Allergies (verified) Allergies as of 10/17/2018   No Known Allergies     Medication List      Accurate as of Oct 17, 2018  2:49 PM. If you have any questions, ask your nurse or doctor.        Calcium 600-200 MG-UNIT tablet Take 1 tablet by mouth daily.   cholecalciferol 1000 units tablet Commonly known as:  VITAMIN D Take 1,000 Units by mouth daily.   lisinopril 10 MG tablet Commonly known as:  ZESTRIL TAKE 1 TABLET BY MOUTH  DAILY.   multivitamin-iron-minerals-folic acid chewable tablet Chew 1 tablet by mouth daily.   naproxen sodium 220 MG tablet Commonly known as:  ALEVE Take 220 mg by mouth.   pravastatin 20 MG tablet Commonly known as:  PRAVACHOL TAKE 1 TABLET BY MOUTH  DAILY.   vitamin C 500 MG tablet Commonly known as:  ASCORBIC ACID Take 500 mg by mouth daily.   Xiidra 5 % Soln Generic drug:  Lifitegrast       History (reviewed): Past Medical History:  Diagnosis Date  . Allergy   . DDD (degenerative disc disease), lumbar   . Hyperlipidemia   . Hypertension    History reviewed. No pertinent surgical history. Family History  Problem Relation Age of Onset  . Cancer Mother        Brain and lung   . Hypertension Mother   . Heart disease Father   . Other Brother        died in war   . Allergies Daughter   . Menstrual problems Daughter   . Allergies Son   . Heart disease Maternal Grandmother   .  Leukemia Maternal Grandfather   . Dementia Paternal Grandmother   . Other Paternal Grandfather        diptheria   . Mental illness Brother   . Obesity Brother   . Allergies Son    Social History   Socioeconomic History  . Marital status: Widowed    Spouse name: Not on file  . Number of children: 3  . Years of education: Not on file  . Highest education level: Not on file  Occupational History  . Occupation: retired    Comment: Nurse, learning disability  . Financial resource strain: Not on file  . Food insecurity:    Worry: Not on file    Inability: Not on file  . Transportation needs:    Medical: Not on file    Non-medical: Not on file   Tobacco Use  . Smoking status: Never Smoker  . Smokeless tobacco: Never Used  Substance and Sexual Activity  . Alcohol use: Yes    Comment: occ  . Drug use: No  . Sexual activity: Not on file  Lifestyle  . Physical activity:    Days per week: Not on file    Minutes per session: Not on file  . Stress: Not on file  Relationships  . Social connections:    Talks on phone: Not on file    Gets together: Not on file    Attends religious service: Not on file    Active member of club or organization: Not on file    Attends meetings of clubs or organizations: Not on file    Relationship status: Not on file  Other Topics Concern  . Not on file  Social History Narrative  . Not on file    Activities of Daily Living In your present state of health, do you have any difficulty performing the following activities: 10/17/2018  Hearing? Y  Comment sinus issues   Vision? Y  Comment wears rx glasses   Difficulty concentrating or making decisions? N  Walking or climbing stairs? N  Dressing or bathing? N  Doing errands, shopping? N  Preparing Food and eating ? N  Using the Toilet? N  In the past six months, have you accidently leaked urine? N  Do you have problems with loss of bowel control? N  Managing your Medications? N  Managing your Finances? N  Housekeeping or managing your Housekeeping? N  Some recent data might be hidden    Patient Literacy    Exercise Current Exercise Habits: Structured exercise class, Type of exercise: walking;calisthenics, Time (Minutes): 45, Frequency (Times/Week): 6, Weekly Exercise (Minutes/Week): 270, Intensity: Moderate, Exercise limited by: None identified  Diet Patient reports consuming 3 meals a day and 1 snack(s) a day Patient reports that her primary diet is: Regular Patient reports that she does not have regular access to food.   Depression Screen PHQ 2/9 Scores 10/17/2018 01/27/2018 01/23/2017 03/14/2016 03/09/2016  PHQ - 2 Score 0 0 0 0 0      Fall Risk Fall Risk  10/17/2018 01/27/2018 01/23/2017 03/14/2016 03/09/2016  Falls in the past year? 0 No No No No     Objective:  Ann Bolton seemed alert and oriented and she participated appropriately during our telephone visit.  Blood Pressure Weight BMI  BP Readings from Last 3 Encounters:  10/17/18 140/65  01/27/18 136/83  01/23/17 132/78   Wt Readings from Last 3 Encounters:  10/17/18 130 lb (59 kg)  01/27/18 128 lb 12.8 oz (58.4  kg)  01/23/17 127 lb 12.8 oz (58 kg)   BMI Readings from Last 1 Encounters:  10/17/18 23.78 kg/m    *Unable to obtain current vital signs, weight, and BMI due to telephone visit type  Hearing/Vision  . Jisele did not seem to have difficulty with hearing/understanding during the telephone conversation . Reports that she has not had a formal eye exam by an eye care professional within the past year . Reports that she has not had a formal hearing evaluation within the past year *Unable to fully assess hearing and vision during telephone visit type  Cognitive Function: 6CIT Screen 10/17/2018  What Year? 0 points  What month? 0 points  What time? 0 points  Count back from 20 0 points  Months in reverse 0 points  Repeat phrase 2 points  Total Score 2    Normal Cognitive Function Screening: Yes (Normal:0-7, Significant for Dysfunction: >8)  Immunization & Health Maintenance Record Immunization History  Administered Date(s) Administered  . Influenza, High Dose Seasonal PF 03/09/2016, 02/25/2018  . Influenza-Unspecified 02/25/2017  . Pneumococcal Conjugate-13 03/09/2016  . Pneumococcal Polysaccharide-23 03/31/2018  . Zoster Recombinat (Shingrix) 01/23/2017, 04/30/2017    Health Maintenance  Topic Date Due  . Janet Berlin  08/24/1962  . COLONOSCOPY  08/23/1993  . DEXA SCAN  08/23/2008  . INFLUENZA VACCINE  12/20/2018  . Hepatitis C Screening  Completed  . PNA vac Low Risk Adult  Completed       Assessment  This is a routine  wellness examination for Ann Bolton.  Health Maintenance: Due or Overdue Health Maintenance Due  Topic Date Due  . TETANUS/TDAP  08/24/1962  . COLONOSCOPY  08/23/1993  . DEXA SCAN  08/23/2008    Ann Bolton does not need a referral for Community Assistance: Care Management:   no Social Work:    no Prescription Assistance:  no Nutrition/Diabetes Education:  no   Plan:  Personalized Goals Goals Addressed            This Visit's Progress   . Prevent falls       Stay active       Personalized Health Maintenance & Screening Recommendations  Td vaccine Colorectal cancer screening  Lung Cancer Screening Recommended: no (Low Dose CT Chest recommended if Age 30-80 years, 30 pack-year currently smoking OR have quit w/in past 15 years) Hepatitis C Screening recommended: no HIV Screening recommended: no  Advanced Directives: Written information was not prepared per patient's request.  Referrals & Orders No orders of the defined types were placed in this encounter.   Follow-up Plan . Follow-up with Remus Loffler, PA-C as planned . Consider COLOgaurd and td at next OV     I have personally reviewed and noted the following in the patient's chart:   . Medical and social history . Use of alcohol, tobacco or illicit drugs  . Current medications and supplements . Functional ability and status . Nutritional status . Physical activity . Advanced directives . List of other physicians . Hospitalizations, surgeries, and ER visits in previous 12 months . Vitals . Screenings to include cognitive, depression, and falls . Referrals and appointments  In addition, I have reviewed and discussed with Ann Bolton certain preventive protocols, quality metrics, and best practice recommendations. A written personalized care plan for preventive services as well as general preventive health recommendations is available and can be mailed to the patient at her request.       Coleman Kalas, Almond Lint  10/17/2018      

## 2018-12-15 ENCOUNTER — Other Ambulatory Visit: Payer: Self-pay | Admitting: Physician Assistant

## 2018-12-15 DIAGNOSIS — E782 Mixed hyperlipidemia: Secondary | ICD-10-CM

## 2018-12-15 DIAGNOSIS — I10 Essential (primary) hypertension: Secondary | ICD-10-CM

## 2019-01-16 ENCOUNTER — Encounter: Payer: Self-pay | Admitting: Physician Assistant

## 2019-01-30 ENCOUNTER — Encounter: Payer: Medicare Other | Admitting: Physician Assistant

## 2019-02-09 ENCOUNTER — Other Ambulatory Visit: Payer: Self-pay | Admitting: Physician Assistant

## 2019-02-09 DIAGNOSIS — I10 Essential (primary) hypertension: Secondary | ICD-10-CM

## 2019-02-09 DIAGNOSIS — E782 Mixed hyperlipidemia: Secondary | ICD-10-CM

## 2019-02-16 ENCOUNTER — Other Ambulatory Visit: Payer: Self-pay

## 2019-02-17 ENCOUNTER — Encounter: Payer: Self-pay | Admitting: Physician Assistant

## 2019-02-17 ENCOUNTER — Ambulatory Visit (INDEPENDENT_AMBULATORY_CARE_PROVIDER_SITE_OTHER): Payer: Medicare Other | Admitting: Physician Assistant

## 2019-02-17 VITALS — BP 135/77 | HR 86 | Temp 97.9°F | Ht 62.0 in | Wt 129.2 lb

## 2019-02-17 DIAGNOSIS — Z23 Encounter for immunization: Secondary | ICD-10-CM

## 2019-02-17 DIAGNOSIS — E782 Mixed hyperlipidemia: Secondary | ICD-10-CM | POA: Diagnosis not present

## 2019-02-17 DIAGNOSIS — Z1231 Encounter for screening mammogram for malignant neoplasm of breast: Secondary | ICD-10-CM

## 2019-02-17 DIAGNOSIS — Z1211 Encounter for screening for malignant neoplasm of colon: Secondary | ICD-10-CM | POA: Diagnosis not present

## 2019-02-17 DIAGNOSIS — Z0001 Encounter for general adult medical examination with abnormal findings: Secondary | ICD-10-CM

## 2019-02-17 DIAGNOSIS — M6530 Trigger finger, unspecified finger: Secondary | ICD-10-CM

## 2019-02-17 DIAGNOSIS — I10 Essential (primary) hypertension: Secondary | ICD-10-CM | POA: Diagnosis not present

## 2019-02-17 DIAGNOSIS — Z0000 Encounter for general adult medical examination without abnormal findings: Secondary | ICD-10-CM

## 2019-02-17 MED ORDER — LISINOPRIL 10 MG PO TABS: 10.0000 mg | ORAL_TABLET | Freq: Every day | ORAL | 3 refills | Status: DC

## 2019-02-17 MED ORDER — PRAVASTATIN SODIUM 20 MG PO TABS: 20.0000 mg | ORAL_TABLET | Freq: Every day | ORAL | 3 refills | Status: DC

## 2019-02-17 NOTE — Patient Instructions (Signed)
Health Maintenance After Age 75 After age 75, you are at a higher risk for certain long-term diseases and infections as well as injuries from falls. Falls are a major cause of broken bones and head injuries in people who are older than age 75. Getting regular preventive care can help to keep you healthy and well. Preventive care includes getting regular testing and making lifestyle changes as recommended by your health care provider. Talk with your health care provider about:  Which screenings and tests you should have. A screening is a test that checks for a disease when you have no symptoms.  A diet and exercise plan that is right for you. What should I know about screenings and tests to prevent falls? Screening and testing are the best ways to find a health problem early. Early diagnosis and treatment give you the best chance of managing medical conditions that are common after age 75. Certain conditions and lifestyle choices may make you more likely to have a fall. Your health care provider may recommend:  Regular vision checks. Poor vision and conditions such as cataracts can make you more likely to have a fall. If you wear glasses, make sure to get your prescription updated if your vision changes.  Medicine review. Work with your health care provider to regularly review all of the medicines you are taking, including over-the-counter medicines. Ask your health care provider about any side effects that may make you more likely to have a fall. Tell your health care provider if any medicines that you take make you feel dizzy or sleepy.  Osteoporosis screening. Osteoporosis is a condition that causes the bones to get weaker. This can make the bones weak and cause them to break more easily.  Blood pressure screening. Blood pressure changes and medicines to control blood pressure can make you feel dizzy.  Strength and balance checks. Your health care provider may recommend certain tests to check your  strength and balance while standing, walking, or changing positions.  Foot health exam. Foot pain and numbness, as well as not wearing proper footwear, can make you more likely to have a fall.  Depression screening. You may be more likely to have a fall if you have a fear of falling, feel emotionally low, or feel unable to do activities that you used to do.  Alcohol use screening. Using too much alcohol can affect your balance and may make you more likely to have a fall. What actions can I take to lower my risk of falls? General instructions  Talk with your health care provider about your risks for falling. Tell your health care provider if: ? You fall. Be sure to tell your health care provider about all falls, even ones that seem minor. ? You feel dizzy, sleepy, or off-balance.  Take over-the-counter and prescription medicines only as told by your health care provider. These include any supplements.  Eat a healthy diet and maintain a healthy weight. A healthy diet includes low-fat dairy products, low-fat (lean) meats, and fiber from whole grains, beans, and lots of fruits and vegetables. Home safety  Remove any tripping hazards, such as rugs, cords, and clutter.  Install safety equipment such as grab bars in bathrooms and safety rails on stairs.  Keep rooms and walkways well-lit. Activity   Follow a regular exercise program to stay fit. This will help you maintain your balance. Ask your health care provider what types of exercise are appropriate for you.  If you need a cane or   walker, use it as recommended by your health care provider.  Wear supportive shoes that have nonskid soles. Lifestyle  Do not drink alcohol if your health care provider tells you not to drink.  If you drink alcohol, limit how much you have: ? 0-1 drink a day for women. ? 0-2 drinks a day for men.  Be aware of how much alcohol is in your drink. In the [place] one drink equals one typical bottle of beer (12  oz), one-half glass of wine (5 oz), or one shot of hard liquor (1 oz).  Do not use any products that contain nicotine or tobacco, such as cigarettes and e-cigarettes. If you need help quitting, ask your health care provider. Summary  Having a healthy lifestyle and getting preventive care can help to protect your health and wellness after age 75.  Screening and testing are the best way to find a health problem early and help you avoid having a fall. Early diagnosis and treatment give you the best chance for managing medical conditions that are more common for people who are older than age 75.  Falls are a major cause of broken bones and head injuries in people who are older than age 75. Take precautions to prevent a fall at home.  Work with your health care provider to learn what changes you can make to improve your health and wellness and to prevent falls. This information is not intended to replace advice given to you by your health care provider. Make sure you discuss any questions you have with your health care provider. Document Released: 03/20/2017 Document Revised: 08/28/2018 Document Reviewed: 03/20/2017 [employer]vier Patient Education  2020 [employer]  

## 2019-02-18 LAB — CMP14+EGFR
ALT: 7 IU/L (ref 0–32)
AST: 35 IU/L (ref 0–40)
Albumin/Globulin Ratio: 2.3 — ABNORMAL HIGH (ref 1.2–2.2)
Albumin: 4.5 g/dL (ref 3.7–4.7)
Alkaline Phosphatase: 69 IU/L (ref 39–117)
BUN/Creatinine Ratio: 16 (ref 12–28)
BUN: 13 mg/dL (ref 8–27)
Bilirubin Total: 0.4 mg/dL (ref 0.0–1.2)
CO2: 19 mmol/L — ABNORMAL LOW (ref 20–29)
Calcium: 9.8 mg/dL (ref 8.7–10.3)
Chloride: 104 mmol/L (ref 96–106)
Creatinine, Ser: 0.81 mg/dL (ref 0.57–1.00)
GFR calc Af Amer: 82 mL/min/{1.73_m2} (ref >59)
GFR calc non Af Amer: 71 mL/min/{1.73_m2} (ref >59)
Globulin, Total: 2 g/dL (ref 1.5–4.5)
Glucose: 100 mg/dL — ABNORMAL HIGH (ref 65–99)
Potassium: 4.9 mmol/L (ref 3.5–5.2)
Sodium: 140 mmol/L (ref 134–144)
Total Protein: 6.5 g/dL (ref 6.0–8.5)

## 2019-02-18 LAB — CBC WITH DIFFERENTIAL/PLATELET
Basophils Absolute: 0 10*3/uL (ref 0.0–0.2)
Basos: 1 %
EOS (ABSOLUTE): 0 10*3/uL (ref 0.0–0.4)
Eos: 0 %
Hematocrit: 41.5 % (ref 34.0–46.6)
Hemoglobin: 14.3 g/dL (ref 11.1–15.9)
Immature Grans (Abs): 0 10*3/uL (ref 0.0–0.1)
Immature Granulocytes: 0 %
Lymphocytes Absolute: 1.7 10*3/uL (ref 0.7–3.1)
Lymphs: 27 %
MCH: 32 pg (ref 26.6–33.0)
MCHC: 34.5 g/dL (ref 31.5–35.7)
MCV: 93 fL (ref 79–97)
Monocytes Absolute: 0.5 10*3/uL (ref 0.1–0.9)
Monocytes: 8 %
Neutrophils Absolute: 3.9 10*3/uL (ref 1.4–7.0)
Neutrophils: 64 %
Platelets: 204 10*3/uL (ref 150–450)
RBC: 4.47 x10E6/uL (ref 3.77–5.28)
RDW: 12.6 % (ref 11.7–15.4)
WBC: 6.1 10*3/uL (ref 3.4–10.8)

## 2019-02-18 LAB — LIPID PANEL
Chol/HDL Ratio: 3.2 ratio (ref 0.0–4.4)
Cholesterol, Total: 177 mg/dL (ref 100–199)
HDL: 55 mg/dL (ref >39)
LDL Chol Calc (NIH): 100 mg/dL — ABNORMAL HIGH (ref 0–99)
Triglycerides: 122 mg/dL (ref 0–149)
VLDL Cholesterol Cal: 22 mg/dL (ref 5–40)

## 2019-02-18 LAB — TSH: TSH: 3.69 u[IU]/mL (ref 0.450–4.500)
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
## 2019-02-20 NOTE — Addendum Note (Signed)
Addended by: Thana Ates on: 02/20/2019 11:02 AM   Modules accepted: Orders

## 2019-04-08 LAB — COLOGUARD: Cologuard: NEGATIVE

## 2019-06-18 ENCOUNTER — Other Ambulatory Visit: Payer: Self-pay

## 2019-06-18 DIAGNOSIS — Z0000 Encounter for general adult medical examination without abnormal findings: Secondary | ICD-10-CM

## 2019-06-18 DIAGNOSIS — Z9189 Other specified personal risk factors, not elsewhere classified: Secondary | ICD-10-CM

## 2019-06-18 DIAGNOSIS — Z780 Asymptomatic menopausal state: Secondary | ICD-10-CM

## 2019-06-18 DIAGNOSIS — E559 Vitamin D deficiency, unspecified: Secondary | ICD-10-CM

## 2019-06-22 ENCOUNTER — Ambulatory Visit (INDEPENDENT_AMBULATORY_CARE_PROVIDER_SITE_OTHER): Payer: Medicare Other

## 2019-06-22 ENCOUNTER — Other Ambulatory Visit: Payer: Self-pay

## 2019-06-22 DIAGNOSIS — E559 Vitamin D deficiency, unspecified: Secondary | ICD-10-CM

## 2019-06-22 DIAGNOSIS — Z0000 Encounter for general adult medical examination without abnormal findings: Secondary | ICD-10-CM | POA: Diagnosis not present

## 2019-06-22 DIAGNOSIS — M810 Age-related osteoporosis without current pathological fracture: Secondary | ICD-10-CM | POA: Insufficient documentation

## 2019-06-22 DIAGNOSIS — Z780 Asymptomatic menopausal state: Secondary | ICD-10-CM

## 2019-06-22 DIAGNOSIS — Z9189 Other specified personal risk factors, not elsewhere classified: Secondary | ICD-10-CM | POA: Diagnosis not present

## 2019-06-29 ENCOUNTER — Encounter: Payer: Self-pay | Admitting: Physician Assistant

## 2019-06-29 ENCOUNTER — Other Ambulatory Visit: Payer: Self-pay | Admitting: Physician Assistant

## 2019-06-29 MED ORDER — ALENDRONATE SODIUM 70 MG PO TABS: 70.0000 mg | ORAL_TABLET | ORAL | 11 refills | Status: DC

## 2019-08-19 ENCOUNTER — Other Ambulatory Visit: Payer: Self-pay

## 2019-08-19 DIAGNOSIS — Z1231 Encounter for screening mammogram for malignant neoplasm of breast: Secondary | ICD-10-CM

## 2019-08-25 ENCOUNTER — Other Ambulatory Visit: Payer: Self-pay | Admitting: *Deleted

## 2019-08-25 NOTE — Telephone Encounter (Signed)
Please have patient come in for a vitamin D check.  This medication really should have not been started without this information.  I will refill this pending vitamin D level

## 2019-08-25 NOTE — Telephone Encounter (Signed)
Patient aware and verbalizes understanding. 

## 2019-10-15 ENCOUNTER — Encounter: Payer: Self-pay | Admitting: Family Medicine

## 2019-10-15 ENCOUNTER — Ambulatory Visit (INDEPENDENT_AMBULATORY_CARE_PROVIDER_SITE_OTHER): Payer: Medicare Other | Admitting: Family Medicine

## 2019-10-15 ENCOUNTER — Other Ambulatory Visit: Payer: Self-pay

## 2019-10-15 VITALS — BP 125/73 | HR 73 | Temp 98.4°F | Wt 128.8 lb

## 2019-10-15 DIAGNOSIS — M810 Age-related osteoporosis without current pathological fracture: Secondary | ICD-10-CM | POA: Diagnosis not present

## 2019-10-15 NOTE — Progress Notes (Signed)
Subjective: CC: Follow-up osteoporosis, establish care PCP: Ann Norlander, DO KGM:Ann Bolton is a 74 y.o. female presenting to clinic today for:  1.  Osteoporosis Patient was noted to have a T score of -2.7 in February.  She was started on Fosamax.  Since starting Fosamax she reports increased fatigue, myalgia and cramping.  She has consider taking something else for her bones.  She is compliant with calcium and vitamin D.  Of note, no vitamin D, PTH was collected with her previous labs.  No record of previous vitamin D level either.  She would be interested in considering something like Prolia.  Denies any GERD symptoms.  Patient reports adequate vitamin D and calcium intake.  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  ROS: Per HPI   No Known Allergies Past Medical History:  Diagnosis Date  . Allergy   . DDD (degenerative disc disease), lumbar   . Hyperlipidemia   . Hypertension     Current Outpatient Medications:  .  alendronate (FOSAMAX) 70 MG tablet, Take 1 tablet (70 mg total) by mouth every 7 (seven) days. Take with a full glass of water on an empty stomach., Disp: 4 tablet, Rfl: 11 .  Calcium 600-200 MG-UNIT tablet, Take 1 tablet by mouth daily., Disp: , Rfl:  .  cholecalciferol (VITAMIN D) 1000 units tablet, Take 1,000 Units by mouth daily., Disp: , Rfl:  .  lisinopril (ZESTRIL) 10 MG tablet, Take 1 tablet (10 mg total) by mouth daily., Disp: 90 tablet, Rfl: 3 .  multivitamin-iron-minerals-folic acid (CENTRUM) chewable tablet, Chew 1 tablet by mouth daily., Disp: , Rfl:  .  naproxen sodium (ALEVE) 220 MG tablet, Take 220 mg by mouth., Disp: , Rfl:  .  pravastatin (PRAVACHOL) 20 MG tablet, Take 1 tablet (20 mg total) by mouth daily., Disp: 90 tablet, Rfl: 3 .  vitamin C (ASCORBIC ACID) 500 MG tablet, Take 500 mg by mouth daily., Disp: , Rfl:  .  XIIDRA 5 % SOLN, Place 1 drop into both eyes 2 (two) times daily. , Disp: , Rfl:  Social History    Socioeconomic History  . Marital status: Widowed    Spouse name: Not on file  . Number of children: 3  . Years of education: Not on file  . Highest education level: Not on file  Occupational History  . Occupation: retired    Comment: Science writer   Tobacco Use  . Smoking status: Never Smoker  . Smokeless tobacco: Never Used  Substance and Sexual Activity  . Alcohol use: Yes    Comment: occ  . Drug use: No  . Sexual activity: Not on file  Other Topics Concern  . Not on file  Social History Narrative  . Not on file   Social Determinants of Health   Financial Resource Strain:   . Difficulty of Paying Living Expenses:   Food Insecurity:   . Worried About Charity fundraiser in the Last Year:   . Arboriculturist in the Last Year:   Transportation Needs:   . Film/video editor (Medical):   Marland Kitchen Lack of Transportation (Non-Medical):   Physical Activity:   . Days of Exercise per Week:   . Minutes of Exercise per Session:   Stress:   . Feeling of Stress :   Social Connections:   . Frequency of Communication with Friends and Family:   . Frequency of Social Gatherings with Friends and Family:   . Attends  Religious Services:   . Active Member of Clubs or Organizations:   . Attends Banker Meetings:   Marland Kitchen Marital Status:   Intimate Partner Violence:   . Fear of Current or Ex-Partner:   . Emotionally Abused:   Marland Kitchen Physically Abused:   . Sexually Abused:    Family History  Problem Relation Age of Onset  . Cancer Mother        Brain and lung   . Hypertension Mother   . Heart disease Father   . Other Brother        died in war   . Allergies Daughter   . Menstrual problems Daughter   . Allergies Son   . Heart disease Maternal Grandmother   . Leukemia Maternal Grandfather   . Dementia Paternal Grandmother   . Other Paternal Grandfather        diptheria   . Mental illness Brother   . Obesity Brother   . Allergies Son     Objective: Office vital signs  reviewed. BP 125/73   Pulse 73   Temp 98.4 F (36.9 C)   Wt 128 lb 12.8 oz (58.4 kg)   SpO2 100%   BMI 23.56 kg/m   Physical Examination:  General: Awake, alert, well nourished, No acute distress HEENT: Normal; no goiter.  Sclera white. Cardio: regular rate and rhythm, S1S2 heard, no murmurs appreciated Pulm: clear to auscultation bilaterally, no wheezes, rhonchi or rales; normal work of breathing on room air Extremities: warm, well perfused, No edema, cyanosis or clubbing; +2 pulses bilaterally MSK: Normal gait and station.  Tone good.  Assessment/ Plan: 74 y.o. female   1. Age-related osteoporosis without current pathological fracture Unfortunately, no vitamin D collected prior to initiation of Fosamax.  I wonder if perhaps she had a low vitamin D level to start with this may be explain why she is having fatigue and myalgia.  I am going to have the nurse contact her to get these collected as our EMR was down during this visit and these records were not easily accessible.  Additionally, she was provided the osteoporosis handout and dietary recommendations.  We will check in to see if Prolia is something that is well covered by insurance as she seems very interested in this and contact her with the results.  I discussed alternative therapies as well including zoledronic acid, Forteo, Evista and even referral to Dr. Cleophas Dunker in [place] for further counseling on osteoporosis treatments and options. - VITAMIN D 25 Hydroxy (Vit-D Deficiency, Fractures) - PTH, Intact and Calcium   No orders of the defined types were placed in this encounter.  No orders of the defined types were placed in this encounter.    Raliegh Ip, DO [facility] [phone]

## 2019-11-02 ENCOUNTER — Ambulatory Visit (INDEPENDENT_AMBULATORY_CARE_PROVIDER_SITE_OTHER): Payer: Medicare Other | Admitting: *Deleted

## 2019-11-02 DIAGNOSIS — Z0000 Encounter for general adult medical examination without abnormal findings: Secondary | ICD-10-CM

## 2019-11-02 NOTE — Progress Notes (Signed)
MEDICARE ANNUAL WELLNESS VISIT  11/02/2019  Telephone Visit Disclaimer This Medicare AWV was conducted by telephone due to national recommendations for restrictions regarding the COVID-19 Pandemic (e.g. social distancing).  I verified, using two identifiers, that I am speaking with Ann Bolton or their authorized healthcare agent. I discussed the limitations, risks, security, and privacy concerns of performing an evaluation and management service by telephone and the potential availability of an in-person appointment in the future. The patient expressed understanding and agreed to proceed.   Subjective:  Ann Bolton is a 74 y.o. female patient of Janora Norlander, DO who had a Medicare Annual Wellness Visit today via telephone. Ann Bolton is retired and lives alone. She has 3 children. She reports that she is socially active and does interact with friends/family regularly. 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 She enjoys gardening and reading.   Patient Care Team: Janora Norlander, DO as PCP - General (Family Medicine)  Advanced Directives 11/02/2019 10/17/2018  Does Patient Have a Medical Advance Directive? Yes Yes  Type of Paramedic of [place];Living will;Out of facility DNR (pink MOST or yellow form) [place];Living will  Does patient want to make changes to medical advance directive? No - Patient declined No - Patient declined  Copy of [place] in Chart? Yes - validated most recent copy scanned in chart (See row information) No - copy requested    Hospital Utilization Over the Past 12 Months: # of hospitalizations or ER visits: 0 # of surgeries: 0  Review of Systems    Patient reports that her overall health is worse compared to last year.  History obtained from the patient.  Patient Reported Readings (BP, Pulse, CBG, Weight,  etc) none  Pain Assessment Pain : No/denies pain     Current Medications & Allergies (verified) Allergies as of 11/02/2019   No Known Allergies     Medication List       Accurate as of November 02, 2019  9:58 AM. If you have any questions, ask your nurse or doctor.        STOP taking these medications   naproxen sodium 220 MG tablet Commonly known as: ALEVE     TAKE these medications   alendronate 70 MG tablet Commonly known as: FOSAMAX Take 1 tablet (70 mg total) by mouth every 7 (seven) days. Take with a full glass of water on an empty stomach.   Calcium 600-200 MG-UNIT tablet Take 1 tablet by mouth daily.   cholecalciferol 1000 units tablet Commonly known as: VITAMIN D Take 1,000 Units by mouth daily.   lisinopril 10 MG tablet Commonly known as: ZESTRIL Take 1 tablet (10 mg total) by mouth daily.   multivitamin-iron-minerals-folic acid chewable tablet Chew 1 tablet by mouth daily.   pravastatin 20 MG tablet Commonly known as: PRAVACHOL Take 1 tablet (20 mg total) by mouth daily.   vitamin C 500 MG tablet Commonly known as: ASCORBIC ACID Take 500 mg by mouth daily.   Xiidra 5 % Soln Generic drug: Lifitegrast Place 1 drop into both eyes 2 (two) times daily.       History (reviewed): Past Medical History:  Diagnosis Date  . Allergy   . DDD (degenerative disc disease), lumbar   . Hyperlipidemia   . Hypertension   . Osteoporosis    Past Surgical History:  Procedure Laterality Date  .  CATARACT EXTRACTION, BILATERAL     Family History  Problem Relation Age of Onset  . Cancer Mother        Brain and lung   . Hypertension Mother   . Heart disease Father   . Other Brother        died in war   . Allergies Daughter   . Menstrual problems Daughter   . Allergies Son   . Heart disease Maternal Grandmother   . Leukemia Maternal Grandfather   . Dementia Paternal Grandmother   . Other Paternal Grandfather        diptheria   . Mental illness Brother    . Obesity Brother   . Allergies Son    Social History   Socioeconomic History  . Marital status: Widowed    Spouse name: Not on file  . Number of children: 3  . Years of education: Not on file  . Highest education level: Not on file  Occupational History  . Occupation: retired    Comment: Photographer   Tobacco Use  . Smoking status: Never Smoker  . Smokeless tobacco: Never Used  Vaping Use  . Vaping Use: Never used  Substance and Sexual Activity  . Alcohol use: Yes    Comment: occ  . Drug use: No  . Sexual activity: Not on file  Other Topics Concern  . Not on file  Social History Narrative  . Not on file   Social Determinants of Health   Financial Resource Strain:   . Difficulty of Paying Living Expenses:   Food Insecurity:   . Worried About Programme researcher, broadcasting/film/video in the Last Year:   . Barista in the Last Year:   Transportation Needs:   . Freight forwarder (Medical):   Marland Kitchen Lack of Transportation (Non-Medical):   Physical Activity:   . Days of Exercise per Week:   . Minutes of Exercise per Session:   Stress:   . Feeling of Stress :   Social Connections:   . Frequency of Communication with Friends and Family:   . Frequency of Social Gatherings with Friends and Family:   . Attends Religious Services:   . Active Member of Clubs or Organizations:   . Attends Banker Meetings:   Marland Kitchen Marital Status:     Activities of Daily Living In your present state of health, do you have any difficulty performing the following activities: 11/02/2019  Hearing? N  Vision? N  Difficulty concentrating or making decisions? Y  Comment some forgetfulness  Walking or climbing stairs? N  Dressing or bathing? N  Doing errands, shopping? N  Preparing Food and eating ? N  Using the Toilet? N  In the past six months, have you accidently leaked urine? Y  Comment occasional, urgency  Do you have problems with loss of bowel control? N  Managing your Medications? N    Managing your Finances? N  Housekeeping or managing your Housekeeping? N  Some recent data might be hidden    Patient Education/ Literacy How often do you need to have someone help you when you read instructions, pamphlets, or other written materials from your doctor or pharmacy?: 1 - Never What is the last grade level you completed in school?: college  Exercise Current Exercise Habits: Structured exercise class, Type of exercise: yoga;stretching;strength training/weights, Time (Minutes): 30, Frequency (Times/Week): 6, Weekly Exercise (Minutes/Week): 180, Intensity: Mild, Exercise limited by: None identified  Diet Patient reports consuming 3 meals a day  and 2 snack(s) a day Patient reports that her primary diet is: Regular Patient reports that she does have regular access to food.   Depression Screen PHQ 2/9 Scores 11/02/2019 02/17/2019 10/17/2018 01/27/2018 01/23/2017 03/14/2016 03/09/2016  PHQ - 2 Score 0 0 0 0 0 0 0     Fall Risk Fall Risk  11/02/2019 02/17/2019 10/17/2018 01/27/2018 01/23/2017  Falls in the past year? 0 0 0 No No     Objective:  Ann Bolton seemed alert and oriented and she participated appropriately during our telephone visit.  Blood Pressure Weight BMI  BP Readings from Last 3 Encounters:  10/15/19 125/73  02/17/19 135/77  10/17/18 140/65   Wt Readings from Last 3 Encounters:  10/15/19 128 lb 12.8 oz (58.4 kg)  02/17/19 129 lb 3.2 oz (58.6 kg)  10/17/18 130 lb (59 kg)   BMI Readings from Last 1 Encounters:  10/15/19 23.56 kg/m    *Unable to obtain current vital signs, weight, and BMI due to telephone visit type  Hearing/Vision  . Ann Bolton did not seem to have difficulty with hearing/understanding during the telephone conversation . Reports that she has had a formal eye exam by an eye care professional within the past year . Reports that she has not had a formal hearing evaluation within the past year *Unable to fully assess hearing and vision during  telephone visit type  Cognitive Function: 6CIT Screen 11/02/2019 10/17/2018  What Year? 0 points 0 points  What month? 0 points 0 points  What time? 0 points 0 points  Count back from 20 0 points 0 points  Months in reverse 0 points 0 points  Repeat phrase 0 points 2 points  Total Score 0 2   (Normal:0-7, Significant for Dysfunction: >8)  Normal Cognitive Function Screening: Yes   Immunization & Health Maintenance Record Immunization History  Administered Date(s) Administered  . Fluad Quad(high Dose 65+) 01/24/2019  . Influenza, High Dose Seasonal PF 03/09/2016, 02/25/2018  . Influenza-Unspecified 02/25/2017, 01/27/2019  . Moderna SARS-COVID-2 Vaccination 06/11/2019, 07/17/2019  . Pneumococcal Conjugate-13 03/09/2016  . Pneumococcal Polysaccharide-23 03/31/2018  . Tdap 02/17/2019  . Zoster Recombinat (Shingrix) 01/23/2017, 04/30/2017    Health Maintenance  Topic Date Due  . INFLUENZA VACCINE  12/20/2019  . TETANUS/TDAP  02/16/2029  . DEXA SCAN  Completed  . COVID-19 Vaccine  Completed  . Hepatitis C Screening  Completed  . PNA vac Low Risk Adult  Completed       Assessment  This is a routine wellness examination for Ann Bolton.  Health Maintenance: Due or Overdue There are no preventive care reminders to display for this patient.  Ann Bolton does not need a referral for Community Assistance: Care Management:   no Social Work:    no Prescription Assistance:  no Nutrition/Diabetes Education:  no   Plan:  Personalized Goals Goals Addressed            This Visit's Progress   . Patient Stated       11/02/2019 AWV Goal: Keep All Scheduled Appointments  Over the next year, patient will attend all scheduled appointments with their PCP and any specialists that they see.       Personalized Health Maintenance & Screening Recommendations    Lung Cancer Screening Recommended: no (Low Dose CT Chest recommended if Age 42-80 years, 30 pack-year currently  smoking OR have quit w/in past 15 years) Hepatitis C Screening recommended: no HIV Screening recommended: no  Advanced Directives: Written information was  not prepared per patient's request.  Referrals & Orders No orders of the defined types were placed in this encounter.   Follow-up Plan . Follow-up with Raliegh Ip, DO as planned   I have personally reviewed and noted the following in the patient's chart:   . Medical and social history . Use of alcohol, tobacco or illicit drugs  . Current medications and supplements . Functional ability and status . Nutritional status . Physical activity . Advanced directives . List of other physicians . Hospitalizations, surgeries, and ER visits in previous 12 months . Vitals . Screenings to include cognitive, depression, and falls . Referrals and appointments  In addition, I have reviewed and discussed with Ann Bolton certain preventive protocols, quality metrics, and best practice recommendations. A written personalized care plan for preventive services as well as general preventive health recommendations is available and can be mailed to the patient at her request.      Adella Hare, LPN  07/10/2540

## 2019-11-25 ENCOUNTER — Ambulatory Visit (INDEPENDENT_AMBULATORY_CARE_PROVIDER_SITE_OTHER): Payer: Medicare Other | Admitting: Family Medicine

## 2019-11-25 ENCOUNTER — Other Ambulatory Visit: Payer: Self-pay

## 2019-11-25 DIAGNOSIS — M810 Age-related osteoporosis without current pathological fracture: Secondary | ICD-10-CM | POA: Diagnosis not present

## 2019-11-25 MED ORDER — DENOSUMAB 60 MG/ML ~~LOC~~ SOSY
60.0000 mg | PREFILLED_SYRINGE | Freq: Once | SUBCUTANEOUS | Status: AC
  Administered 2019-11-25: 60 mg via SUBCUTANEOUS

## 2019-11-25 NOTE — Progress Notes (Signed)
Patient given prolia and tolerated well.  

## 2019-12-30 ENCOUNTER — Other Ambulatory Visit: Payer: Self-pay | Admitting: *Deleted

## 2019-12-30 DIAGNOSIS — E782 Mixed hyperlipidemia: Secondary | ICD-10-CM

## 2019-12-30 DIAGNOSIS — I10 Essential (primary) hypertension: Secondary | ICD-10-CM

## 2019-12-30 MED ORDER — PRAVASTATIN SODIUM 20 MG PO TABS: 20.0000 mg | ORAL_TABLET | Freq: Every day | ORAL | 0 refills | Status: DC

## 2019-12-30 MED ORDER — LISINOPRIL 10 MG PO TABS: 10.0000 mg | ORAL_TABLET | Freq: Every day | ORAL | 0 refills | Status: DC

## 2020-01-28 DIAGNOSIS — H04123 Dry eye syndrome of bilateral lacrimal glands: Secondary | ICD-10-CM | POA: Diagnosis not present

## 2020-01-28 DIAGNOSIS — H40033 Anatomical narrow angle, bilateral: Secondary | ICD-10-CM | POA: Diagnosis not present

## 2020-01-30 DIAGNOSIS — H10013 Acute follicular conjunctivitis, bilateral: Secondary | ICD-10-CM | POA: Diagnosis not present

## 2020-02-05 DIAGNOSIS — H04123 Dry eye syndrome of bilateral lacrimal glands: Secondary | ICD-10-CM | POA: Diagnosis not present

## 2020-02-17 ENCOUNTER — Encounter: Payer: Self-pay | Admitting: Family Medicine

## 2020-02-17 ENCOUNTER — Ambulatory Visit (INDEPENDENT_AMBULATORY_CARE_PROVIDER_SITE_OTHER): Payer: Medicare Other | Admitting: Family Medicine

## 2020-02-17 ENCOUNTER — Other Ambulatory Visit: Payer: Self-pay

## 2020-02-17 VITALS — BP 128/88 | HR 78 | Temp 98.0°F | Ht 62.0 in | Wt 125.6 lb

## 2020-02-17 DIAGNOSIS — M810 Age-related osteoporosis without current pathological fracture: Secondary | ICD-10-CM | POA: Diagnosis not present

## 2020-02-17 DIAGNOSIS — I10 Essential (primary) hypertension: Secondary | ICD-10-CM | POA: Diagnosis not present

## 2020-02-17 DIAGNOSIS — Z23 Encounter for immunization: Secondary | ICD-10-CM

## 2020-02-17 DIAGNOSIS — G8929 Other chronic pain: Secondary | ICD-10-CM

## 2020-02-17 DIAGNOSIS — F419 Anxiety disorder, unspecified: Secondary | ICD-10-CM

## 2020-02-17 DIAGNOSIS — Z0000 Encounter for general adult medical examination without abnormal findings: Secondary | ICD-10-CM | POA: Diagnosis not present

## 2020-02-17 DIAGNOSIS — E7800 Pure hypercholesterolemia, unspecified: Secondary | ICD-10-CM

## 2020-02-17 DIAGNOSIS — H9192 Unspecified hearing loss, left ear: Secondary | ICD-10-CM

## 2020-02-17 MED ORDER — DULOXETINE HCL 30 MG PO CPEP: 30.0000 mg | ORAL_CAPSULE | Freq: Every day | ORAL | 0 refills | Status: DC

## 2020-02-17 MED ORDER — LISINOPRIL 10 MG PO TABS: 10.0000 mg | ORAL_TABLET | Freq: Every day | ORAL | 3 refills | Status: DC

## 2020-02-17 MED ORDER — PRAVASTATIN SODIUM 20 MG PO TABS: 20.0000 mg | ORAL_TABLET | Freq: Every day | ORAL | 3 refills | Status: DC

## 2020-02-17 NOTE — Progress Notes (Signed)
Ann Bolton is a 74 y.o. female presents to office today for annual physical exam examination.    Concerns today include: 1.  Anxiety/chronic pain/osteoporosis Patient reports that she has been having chronic pain and change in exercise tolerance since starting osteoporosis medicines.  She was previously on Fosamax but switched over to Prolia and she actually feels that the pain may be slightly worse but also notes that she has been having quite a bit of increased stress surrounding her daughter's recent diagnosis of breast cancer and the fact that the [employer], where she works, may be closing.  She uses CBD oil and this does seem to help with some of the joint pain.  However, anxiety does not feel very well controlled.  She is had anxiety in the past.  She would be interested in starting medication for this.  2.  Hypertension/hyperlipidemia Patient reports compliance with lisinopril and pravastatin.  Has had some pains as above.  No chest pain, shortness of breath.  Vision has progressed some since last year and her glasses need to be changed but she denies any blurry vision with corrective lenses.  3.  Hearing loss Patient reports some hearing loss on the left side this been more prominent over the last several months.  She sometimes feels that she has something lodged in her ear.  Denies any drainage.  She has not seen an audiologist but is considering 1.  Occupation: Works at the [employer]: Balanced, Exercise: Regular Last eye exam: Up-to-date Last colonoscopy: Up-to-date Last mammogram: Up-to-date Last pap smear: N/A Refills needed today: Lisinopril pravastatin Immunizations needed: Flu Vaccine: Yes  Past Medical History:  Diagnosis Date  . Allergy   . DDD (degenerative disc disease), lumbar   . Hyperlipidemia   . Hypertension   . Osteoporosis    Social History   Socioeconomic History  . Marital status: Widowed    Spouse name: Not on file  . Number of children: 3  . Years  of education: Not on file  . Highest education level: Not on file  Occupational History  . Occupation: retired    Comment: Science writer   Tobacco Use  . Smoking status: Never Smoker  . Smokeless tobacco: Never Used  Vaping Use  . Vaping Use: Never used  Substance and Sexual Activity  . Alcohol use: Yes    Comment: occ  . Drug use: No  . Sexual activity: Not on file  Other Topics Concern  . Not on file  Social History Narrative  . Not on file   Social Determinants of Health   Financial Resource Strain:   . Difficulty of Paying Living Expenses: Not on file  Food Insecurity:   . Worried About Charity fundraiser in the Last Year: Not on file  . Ran Out of Food in the Last Year: Not on file  Transportation Needs:   . Lack of Transportation (Medical): Not on file  . Lack of Transportation (Non-Medical): Not on file  Physical Activity:   . Days of Exercise per Week: Not on file  . Minutes of Exercise per Session: Not on file  Stress:   . Feeling of Stress : Not on file  Social Connections:   . Frequency of Communication with Friends and Family: Not on file  . Frequency of Social Gatherings with Friends and Family: Not on file  . Attends Religious Services: Not on file  . Active Member of Clubs or Organizations: Not on file  . Attends  Club or Organization Meetings: Not on file  . Marital Status: Not on file  Intimate Partner Violence:   . Fear of Current or Ex-Partner: Not on file  . Emotionally Abused: Not on file  . Physically Abused: Not on file  . Sexually Abused: Not on file   Past Surgical History:  Procedure Laterality Date  . CATARACT EXTRACTION, BILATERAL     Family History  Problem Relation Age of Onset  . Cancer Mother        Brain and lung   . Hypertension Mother   . Heart disease Father   . Other Brother        died in war   . Allergies Daughter   . Menstrual problems Daughter   . Allergies Son   . Heart disease Maternal Grandmother   . Leukemia  Maternal Grandfather   . Dementia Paternal Grandmother   . Other Paternal Grandfather        diptheria   . Mental illness Brother   . Obesity Brother   . Allergies Son     Current Outpatient Medications:  .  Calcium 600-200 MG-UNIT tablet, Take 1 tablet by mouth daily., Disp: , Rfl:  .  cholecalciferol (VITAMIN D) 1000 units tablet, Take 1,000 Units by mouth daily., Disp: , Rfl:  .  lisinopril (ZESTRIL) 10 MG tablet, Take 1 tablet (10 mg total) by mouth daily., Disp: 90 tablet, Rfl: 0 .  multivitamin-iron-minerals-folic acid (CENTRUM) chewable tablet, Chew 1 tablet by mouth daily., Disp: , Rfl:  .  pravastatin (PRAVACHOL) 20 MG tablet, Take 1 tablet (20 mg total) by mouth daily., Disp: 90 tablet, Rfl: 0 .  vitamin C (ASCORBIC ACID) 500 MG tablet, Take 500 mg by mouth daily., Disp: , Rfl:  .  XIIDRA 5 % SOLN, Place 1 drop into both eyes 2 (two) times daily. , Disp: , Rfl:   No Known Allergies   ROS: Review of Systems Pertinent items noted in HPI and remainder of comprehensive ROS otherwise negative.    Physical exam BP 128/88   Pulse 78   Temp 98 F (36.7 C)   Ht 5' 2"  (1.575 m)   Wt 125 lb 9.6 oz (57 kg)   SpO2 99%   BMI 22.97 kg/m  General appearance: alert, cooperative, appears stated age and no distress Head: Normocephalic, without obvious abnormality, atraumatic Eyes: negative findings: lids and lashes normal, conjunctivae and sclerae normal and corneas clear Ears: normal TM's and external ear canals both ears Nose: Nares normal. Septum midline. Mucosa normal. No drainage or sinus tenderness. Throat: lips, mucosa, and tongue normal; teeth and gums normal Neck: no adenopathy, supple, symmetrical, trachea midline and thyroid not enlarged, symmetric, no tenderness/mass/nodules Back: symmetric, no curvature. ROM normal. No CVA tenderness. Lungs: clear to auscultation bilaterally Heart: regular rate and rhythm, S1, S2 normal, no murmur, click, rub or gallop Abdomen: soft,  non-tender; bowel sounds normal; no masses,  no organomegaly Extremities: extremities normal, atraumatic, no cyanosis or edema Pulses: 2+ and symmetric Skin: Small vascular/pigmented lesion noted along the lower right lip (15 years chronic). Lymph nodes: Cervical, supraclavicular, and axillary nodes normal. Neurologic: Alert and oriented X 3, normal strength and tone. Normal symmetric reflexes. Normal coordination and gait Psych: Mood stable, speech normal, insight appropriate.  Thought process linear.  Good eye contact.  Depression screen [facility] 2/9 02/17/2020 11/02/2019 02/17/2019  Decreased Interest 0 0 0  Down, Depressed, Hopeless 0 0 0  PHQ - 2 Score 0 0 0  GAD 7 : Generalized Anxiety Score 02/17/2020  Nervous, Anxious, on Edge 2  Control/stop worrying 1  Worry too much - different things 2  Trouble relaxing 0  Restless 0  Easily annoyed or irritable 0  Afraid - awful might happen 1  Total GAD 7 Score 6  Anxiety Difficulty Not difficult at all     Assessment/ Plan: Ann Bolton here for annual physical exam.   1. Annual physical exam Influenza vaccine administered.  She is up-to-date on rest of health maintenance.  2. Age-related osteoporosis without current pathological fracture Check labs.  On Prolia.  We discussed alternatives and referral but she would like to hold off on this for now to see if Cymbalta helps with symptoms she has been experiencing - CMP14+EGFR - VITAMIN D 25 Hydroxy (Vit-D Deficiency, Fractures) - CBC  3. Pure hypercholesterolemia Check fasting lipid - Lipid Panel - CMP14+EGFR - TSH - CBC - pravastatin (PRAVACHOL) 20 MG tablet; Take 1 tablet (20 mg total) by mouth daily.  Dispense: 90 tablet; Refill: 3  4. Essential hypertension Well-controlled after recheck - lisinopril (ZESTRIL) 10 MG tablet; Take 1 tablet (10 mg total) by mouth daily.  Dispense: 90 tablet; Refill: 3  5. Anxiety Start Cymbalta 30 mg daily.  Follow-up in 4 weeks for  medication titration/response.  Offered counseling as well - DULoxetine (CYMBALTA) 30 MG capsule; Take 1 capsule (30 mg total) by mouth daily.  Dispense: 30 capsule; Refill: 0  6. Other chronic pain Unsure if this is due to osteoporosis medicines.  I find it to be probably unlikely given change from Fosamax to Prolia but certainly not impossible.  I do think that her anxiety is likely exacerbating musculoskeletal pains.  Trial of Cymbalta - DULoxetine (CYMBALTA) 30 MG capsule; Take 1 capsule (30 mg total) by mouth daily.  Dispense: 30 capsule; Refill: 0  7. Need for immunization against influenza - Flu Vaccine QUAD High Dose(Fluad)  8. Hearing loss of left ear, unspecified hearing loss type No evidence of obstruction on today's exam.  I offered referral to audiology but she will see if she can arrange this independently and if not will contact me for referral.   Seydou Hearns M. Lajuana Ripple, DO

## 2020-02-17 NOTE — Patient Instructions (Signed)
You had labs performed today.  You will be contacted with the results of the labs once they are available, usually in the next 3 business days for routine lab work.  If you have an active my chart account, they will be released to your MyChart.  If you prefer to have these labs released to you via telephone, please let us know.  If you had a pap smear or biopsy performed, expect to be contacted in about 7-10 days.   Taking the medicine as directed and not missing any doses is one of the best things you can do to treat your anxiety/depression.  Here are some things to keep in mind:  1) Side effects (stomach upset, some increased anxiety) may happen before you notice a benefit.  These side effects typically go away over time. 2) Changes to your dose of medicine or a change in medication all together is sometimes necessary 3) Most people need to be on medication at least 12 months 4) Many people will notice an improvement within two weeks but the full effect of the medication can take up to 4-6 weeks 5) Stopping the medication when you start feeling better often results in a return of symptoms 6) Never discontinue your medication without contacting a health care professional first.  Some medications require gradual discontinuation/ taper and can make you sick if you stop them abruptly.  If your symptoms worsen or you have thoughts of suicide/homicide, PLEASE SEEK IMMEDIATE MEDICAL ATTENTION.  You may always call:  National Suicide Hotline: 351-736-6426 [place] Crisis Line: 215-059-1802 Crisis Recovery in [place]: (847)796-4812   These are available 24 hours a day, 7 days a week.

## 2020-02-18 ENCOUNTER — Other Ambulatory Visit: Payer: Self-pay

## 2020-02-18 DIAGNOSIS — R7989 Other specified abnormal findings of blood chemistry: Secondary | ICD-10-CM

## 2020-02-18 LAB — CMP14+EGFR
ALT: 7 IU/L (ref 0–32)
AST: 26 IU/L (ref 0–40)
Albumin/Globulin Ratio: 2 (ref 1.2–2.2)
Albumin: 4.5 g/dL (ref 3.7–4.7)
Alkaline Phosphatase: 64 IU/L (ref 44–121)
BUN/Creatinine Ratio: 15 (ref 12–28)
BUN: 11 mg/dL (ref 8–27)
Bilirubin Total: 0.6 mg/dL (ref 0.0–1.2)
CO2: 26 mmol/L (ref 20–29)
Calcium: 9.7 mg/dL (ref 8.7–10.3)
Chloride: 101 mmol/L (ref 96–106)
Creatinine, Ser: 0.73 mg/dL (ref 0.57–1.00)
GFR calc Af Amer: 93 mL/min/{1.73_m2} (ref >59)
GFR calc non Af Amer: 80 mL/min/{1.73_m2} (ref >59)
Globulin, Total: 2.2 g/dL (ref 1.5–4.5)
Glucose: 90 mg/dL (ref 65–99)
Potassium: 4.7 mmol/L (ref 3.5–5.2)
Sodium: 142 mmol/L (ref 134–144)
Total Protein: 6.7 g/dL (ref 6.0–8.5)

## 2020-02-18 LAB — CBC
Hematocrit: 42 % (ref 34.0–46.6)
Hemoglobin: 14.7 g/dL (ref 11.1–15.9)
MCH: 32.9 pg (ref 26.6–33.0)
MCHC: 35 g/dL (ref 31.5–35.7)
MCV: 94 fL (ref 79–97)
Platelets: 202 10*3/uL (ref 150–450)
RBC: 4.47 x10E6/uL (ref 3.77–5.28)
RDW: 12.7 % (ref 11.7–15.4)
WBC: 5.5 10*3/uL (ref 3.4–10.8)

## 2020-02-18 LAB — LIPID PANEL
Chol/HDL Ratio: 2.9 ratio (ref 0.0–4.4)
Cholesterol, Total: 184 mg/dL (ref 100–199)
HDL: 63 mg/dL (ref >39)
LDL Chol Calc (NIH): 98 mg/dL (ref 0–99)
Triglycerides: 132 mg/dL (ref 0–149)
VLDL Cholesterol Cal: 23 mg/dL (ref 5–40)

## 2020-02-18 LAB — TSH: TSH: 5.04 u[IU]/mL — ABNORMAL HIGH (ref 0.450–4.500)

## 2020-02-18 LAB — VITAMIN D 25 HYDROXY (VIT D DEFICIENCY, FRACTURES): Vit D, 25-Hydroxy: 89.1 ng/mL (ref 30.0–100.0)

## 2020-02-20 LAB — SPECIMEN STATUS REPORT

## 2020-02-20 LAB — T4, FREE: Free T4: 1.16 ng/dL (ref 0.82–1.77)

## 2020-02-22 ENCOUNTER — Other Ambulatory Visit: Payer: Self-pay | Admitting: Family Medicine

## 2020-02-22 DIAGNOSIS — Z1231 Encounter for screening mammogram for malignant neoplasm of breast: Secondary | ICD-10-CM

## 2020-03-15 ENCOUNTER — Encounter: Payer: Self-pay | Admitting: Family Medicine

## 2020-03-15 ENCOUNTER — Other Ambulatory Visit: Payer: Self-pay

## 2020-03-15 ENCOUNTER — Ambulatory Visit (INDEPENDENT_AMBULATORY_CARE_PROVIDER_SITE_OTHER): Payer: Medicare Other | Admitting: Family Medicine

## 2020-03-15 VITALS — BP 133/75 | HR 85 | Temp 98.0°F | Ht 62.0 in | Wt 126.4 lb

## 2020-03-15 DIAGNOSIS — F419 Anxiety disorder, unspecified: Secondary | ICD-10-CM

## 2020-03-15 DIAGNOSIS — E7800 Pure hypercholesterolemia, unspecified: Secondary | ICD-10-CM | POA: Diagnosis not present

## 2020-03-15 DIAGNOSIS — R7989 Other specified abnormal findings of blood chemistry: Secondary | ICD-10-CM | POA: Diagnosis not present

## 2020-03-15 DIAGNOSIS — I10 Essential (primary) hypertension: Secondary | ICD-10-CM

## 2020-03-15 MED ORDER — PRAVASTATIN SODIUM 20 MG PO TABS: 20.0000 mg | ORAL_TABLET | Freq: Every day | ORAL | 0 refills | Status: DC

## 2020-03-15 MED ORDER — LISINOPRIL 10 MG PO TABS: 10.0000 mg | ORAL_TABLET | Freq: Every day | ORAL | 0 refills | Status: DC

## 2020-03-15 NOTE — Progress Notes (Signed)
Subjective: CC: anxiety PCP: Raliegh Ip, DO Ann Bolton is a 74 y.o. female presenting to clinic today for:  1. Anxiety Patient reports intolerance to Cymbalta due to excessive grogginess. She discontinued the medication. She instead has doubled up on her CBD supplement at bedtime and this has worked wonders. She is sleeping well and feels that anxiety has gotten under much better control. Additionally, she notes that the situation has changed. Her daughter, who was diagnosed with breast cancer, was recently told that her breast cancer has a high likelihood of not requiring chemotherapy and instead can be treated with tamoxifen. She continues to worry about her job at [employer], which is [place] closing [place].  2. Hypertension with hyperlipidemia Patient is compliant with her lisinopril and Pravachol but unfortunately the package from the mail order was lost in the mail. She needs a bridge supply sent to her local pharmacy  3. Abnormal TSH Patient denies any use of biotin-containing products. She does not report any change in energy, heart palpitations or tremor.  ROS: Per HPI  No Known Allergies Past Medical History:  Diagnosis Date   Allergy    DDD (degenerative disc disease), lumbar    Hyperlipidemia    Hypertension    Osteoporosis     Current Outpatient Medications:    Calcium 600-200 MG-UNIT tablet, Take 1 tablet by mouth daily., Disp: , Rfl:    cholecalciferol (VITAMIN D) 1000 units tablet, Take 1,000 Units by mouth daily., Disp: , Rfl:    DULoxetine (CYMBALTA) 30 MG capsule, Take 1 capsule (30 mg total) by mouth daily., Disp: 30 capsule, Rfl: 0   lisinopril (ZESTRIL) 10 MG tablet, Take 1 tablet (10 mg total) by mouth daily., Disp: 90 tablet, Rfl: 3   multivitamin-iron-minerals-folic acid (CENTRUM) chewable tablet, Chew 1 tablet by mouth daily., Disp: , Rfl:    pravastatin (PRAVACHOL) 20 MG tablet, Take 1 tablet (20 mg total)  by mouth daily., Disp: 90 tablet, Rfl: 3   vitamin C (ASCORBIC ACID) 500 MG tablet, Take 500 mg by mouth daily., Disp: , Rfl:    XIIDRA 5 % SOLN, Place 1 drop into both eyes 2 (two) times daily. , Disp: , Rfl:  Social History   Socioeconomic History   Marital status: Widowed    Spouse name: Not on file   Number of children: 3   Years of education: Not on file   Highest education level: Not on file  Occupational History   Occupation: retired    Comment: banking   Tobacco Use   Smoking status: Never Smoker   Smokeless tobacco: Never Used  Building services engineer Use: Never used  Substance and Sexual Activity   Alcohol use: Yes    Comment: occ   Drug use: No   Sexual activity: Not on file  Other Topics Concern   Not on file  Social History Narrative   Not on file   Social Determinants of Health   Financial Resource Strain:    Difficulty of Paying Living Expenses: Not on file  Food Insecurity:    Worried About Programme researcher, broadcasting/film/video in the Last Year: Not on file   [employer] of Food in the Last Year: Not on file  Transportation Needs:    Lack of Transportation (Medical): Not on file   Lack of Transportation (Non-Medical): Not on file  Physical Activity:    Days of Exercise per Week: Not on file  Minutes of Exercise per Session: Not on file  Stress:    Feeling of Stress : Not on file  Social Connections:    Frequency of Communication with Friends and Family: Not on file   Frequency of Social Gatherings with Friends and Family: Not on file   Attends Religious Services: Not on file   Active Member of Clubs or Organizations: Not on file   Attends Banker Meetings: Not on file   Marital Status: Not on file  Intimate Partner Violence:    Fear of Current or Ex-Partner: Not on file   Emotionally Abused: Not on file   Physically Abused: Not on file   Sexually Abused: Not on file   Family History  Problem Relation Age of Onset    Cancer Mother        Brain and lung    Hypertension Mother    Heart disease Father    Other Brother        died in war    Allergies Daughter    Menstrual problems Daughter    Allergies Son    Heart disease Maternal Grandmother    Leukemia Maternal Grandfather    Dementia Paternal Grandmother    Other Paternal Grandfather        diptheria    Mental illness Brother    Obesity Brother    Allergies Son     Objective: Office vital signs reviewed. BP 133/75    Pulse 85    Temp 98 F (36.7 C)    Ht 5\' 2"  (1.575 m)    Wt 126 lb 6.4 oz (57.3 kg)    SpO2 98%    BMI 23.12 kg/m   Physical Examination:  General: Awake, alert, well nourished, No acute distress HEENT: Normal, sclera white Cardio: regular rate and rhythm, S1S2 heard, no murmurs appreciated Pulm: clear to auscultation bilaterally, no wheezes, rhonchi or rales; normal work of breathing on room air Psych: Mood stable, speech normal, affect appropriate, pleasant and interactive. She has good eye contact.  Depression screen [facility] 2/9 03/15/2020 02/17/2020 11/02/2019  Decreased Interest 0 0 0  Down, Depressed, Hopeless 0 0 0  PHQ - 2 Score 0 0 0  Altered sleeping 1 - -  Tired, decreased energy 0 - -  Change in appetite 0 - -  Feeling bad or failure about yourself  0 - -  Trouble concentrating 0 - -  Moving slowly or fidgety/restless 0 - -  Suicidal thoughts 0 - -  PHQ-9 Score 1 - -  Difficult doing work/chores Not difficult at all - -   GAD 7 : Generalized Anxiety Score 03/15/2020 02/17/2020  Nervous, Anxious, on Edge 0 2  Control/stop worrying 0 1  Worry too much - different things 0 2  Trouble relaxing 0 0  Restless 0 0  Easily annoyed or irritable 0 0  Afraid - awful might happen 0 1  Total GAD 7 Score 0 6  Anxiety Difficulty - Not difficult at all    Assessment/ Plan: 74 y.o. female   1. Abnormal TSH Check thyroid panel. We discussed threshold of treatment of TSH greater than 7 and or abnormal  thyroid level. - Thyroid Panel With TSH  2. Anxiety Stable with herbal remedies.  3. Pure hypercholesterolemia Stable. 30-day bridge supply sent to pharmacy of Pravachol  4. Essential hypertension Controlled. 30-day Rx sent to local pharmacy   No orders of the defined types were placed in this encounter.  No orders of  the defined types were placed in this encounter.    Janora Norlander, DO [place] [phone]

## 2020-03-16 ENCOUNTER — Ambulatory Visit (INDEPENDENT_AMBULATORY_CARE_PROVIDER_SITE_OTHER): Payer: Medicare Other

## 2020-03-16 DIAGNOSIS — Z23 Encounter for immunization: Secondary | ICD-10-CM

## 2020-03-16 LAB — THYROID PANEL WITH TSH
Free Thyroxine Index: 1.7 (ref 1.2–4.9)
T3 Uptake Ratio: 25 % (ref 24–39)
T4, Total: 6.8 ug/dL (ref 4.5–12.0)
TSH: 4.12 u[IU]/mL (ref 0.450–4.500)

## 2020-03-16 NOTE — Progress Notes (Signed)
° °  Covid-19 Vaccination Clinic  Name:  DYNASTI KERMAN    MRN: [id number] DOB: 09/16/1945  03/16/2020  Ms. Bognar was observed post Covid-19 immunization for 15 minutes without incident. She was provided with Vaccine Information Sheet and instruction to access the V-Safe system.   Ms. Cochrane was instructed to call 911 with any severe reactions post vaccine:  Difficulty breathing   Swelling of face and throat   A fast heartbeat   A bad rash all over body   Dizziness and weakness

## 2020-04-25 ENCOUNTER — Encounter: Payer: Self-pay | Admitting: Family Medicine

## 2020-04-27 ENCOUNTER — Other Ambulatory Visit: Payer: Self-pay

## 2020-04-27 ENCOUNTER — Ambulatory Visit
Admission: RE | Admit: 2020-04-27 | Discharge: 2020-04-27 | Disposition: A | Discharge status: Home / Self Care | Payer: Medicare Other | Source: Ambulatory Visit | Attending: Family Medicine | Admitting: Family Medicine

## 2020-04-27 DIAGNOSIS — Z1231 Encounter for screening mammogram for malignant neoplasm of breast: Secondary | ICD-10-CM

## 2020-06-27 ENCOUNTER — Encounter: Payer: Self-pay | Admitting: Family Medicine

## 2020-06-28 ENCOUNTER — Other Ambulatory Visit: Payer: Self-pay | Admitting: Family Medicine

## 2020-06-28 DIAGNOSIS — M810 Age-related osteoporosis without current pathological fracture: Secondary | ICD-10-CM

## 2020-06-28 MED ORDER — IBANDRONATE SODIUM 150 MG PO TABS: 150.0000 mg | ORAL_TABLET | ORAL | 99 refills | Status: DC

## 2020-10-21 ENCOUNTER — Encounter: Payer: Self-pay | Admitting: Family Medicine

## 2020-10-24 ENCOUNTER — Other Ambulatory Visit: Payer: Self-pay

## 2020-10-24 ENCOUNTER — Encounter: Payer: Self-pay | Admitting: Physician Assistant

## 2020-10-24 ENCOUNTER — Ambulatory Visit (INDEPENDENT_AMBULATORY_CARE_PROVIDER_SITE_OTHER): Payer: Medicare Other | Admitting: Physician Assistant

## 2020-10-24 VITALS — BP 139/79 | HR 76 | Temp 98.2°F | Ht 61.0 in | Wt 129.2 lb

## 2020-10-24 DIAGNOSIS — M79605 Pain in left leg: Secondary | ICD-10-CM | POA: Diagnosis not present

## 2020-10-24 NOTE — Progress Notes (Signed)
  Subjective:     Patient ID: Ann Bolton, female   DOB: 08/31/1945, 75 y.o.   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  Musculoskeletal: Positive for arthralgias and myalgias. Negative for back pain, gait problem and joint swelling.       Objective:   Physical Exam Vitals and nursing note reviewed.    Gait normal No lower ext edema FROM of the knee + TTP of the lateral knee No post calf TTP Generalized TTP of the L ant tib Good strength that is equal in the foot/ankle FROM of the hip Wanting to do Xray but unable    Assessment:     L leg pain    Plan:     Appt for Xray  of lower ext ordered Discussed possible stress rxn Heat/Ice Exercise rest/modification OTC  meds for sx F/U pending labs

## 2020-10-24 NOTE — Patient Instructions (Signed)
Stress Fracture  A stress fracture is a small break or crack in a bone. A stress fracture can be fully broken (complete) or partially broken (incomplete). The most common sites for stress fractures are the bones in the front of your feet (metatarsals), your heel (calcaneus), and the long bone of your lower leg (tibia). What are the causes? This condition is caused by overuse or repetitive exercise, such as running. It happens when a bone cannot absorb any more shock because the muscles around it are weak. Stress fractures happen most commonly when:  You rapidly increase or start a new physical activity.  You use shoes that are worn out or do not fit properly.  You exercise on a new surface. What increases the risk? You are more likely to develop this condition if:  You have a condition that causes weak bones (osteoporosis).  You are female. Stress fractures are more likely to occur in women. What are the signs or symptoms? The most common symptom of a stress fracture is feeling pain when you are using or putting weight on the affected part of your body. The pain usually improves when you are resting. Other symptoms may include:  Swelling of the affected area.  Pain in the area when it is touched. Stress fracture pain usually develops over time. How is this diagnosed? This condition may be diagnosed by:  Your symptoms.  Your medical history.  A physical exam.  Imaging tests, such as: ? X-rays. ? MRI. ? Bone scan. How is this treated? Treatment depends on the severity of your stress fracture. It is commonly treated with resting, icing, compression, and elevation (RICE therapy). Treatment may also include:  Medicines to reduce inflammation.  A cast or a walking shoe.  Crutches.  Surgery. This is usually only in severe cases. Follow these instructions at home: If you have a cast:  Do not put pressure on any part of the cast until it is fully hardened. This may take  several hours.  Do not stick anything inside the cast to scratch your skin. Doing that increases your risk of infection.  Check the skin around the cast every day. Tell your health care provider about any concerns.  You may put lotion on dry skin around the edges of the cast. Do not put lotion on the skin underneath the cast.  Keep the cast clean.  If the cast is not waterproof: ? Do not let it get wet. ? Cover it with a watertight covering when you take a bath or a shower. If you have a walking shoe:  Wear the shoe as told by your health care provider. Remove it only as told by your health care provider.  Loosen the shoe if your toes tingle, become numb, or turn cold and blue.  Keep the shoe clean.  If the shoe is not waterproof: ? Do not let it get wet. Managing pain, stiffness, and swelling  If directed, apply ice to the injured area: ? If you have a walking shoe, remove the shoe as told by your health care provider. ? Put ice in a plastic bag. ? Place a towel between your skin and the bag or between your cast and the bag. ? Leave the ice on for 20 minutes, 2-3 times per day.  Move your toes often to avoid stiffness and to lessen swelling.  Raise (elevate) the injured area above the level of your heart while you are sitting or lying down.   Activity    Rest as directed by your health care provider. Ask your health care provider if you may do alternative exercises, such as swimming or biking, while you are healing.  Return to your normal activities as directed by your health care provider. Ask your health care provider what activities are safe for you.  Perform range-of-motion exercises only as directed by your health care provider. General instructions  Do not use the injured limb to support yourbody weight until your health care provider says that you can. Use crutches if your health care provider tells you to do so.  Do not use any products that contain nicotine or  tobacco, such as cigarettes and e-cigarettes. These can delay bone healing. If you need help quitting, ask your health care provider.  Take over-the-counter and prescription medicines only as told by your health care provider.  Keep all follow-up visits as told by your health care provider. This is important. How is this prevented?  Only wear shoes that: ? Fit well. ? Are not worn out.  Eat a healthy diet that contains vitamin D and calcium. This helps keep your bones strong. Good sources of calcium and vitamin D include: ? Low-fat dairy products such as milk, yogurt, and cheese. ? Certain fish, such as fresh or canned salmon, tuna, and sardines. ? Products that have calcium and vitamin D added to them (fortified products), such as fortified cereals or juice.  Be careful when you start a new physical activity. Give your body time to adjust.  Avoid doing only one kind of activity. Do different exercises, such as swimming and running, so that no single part of your body gets overused.  Do strength-training exercises. Contact a health care provider if:  Your pain gets worse.  You have new symptoms.  You have increased swelling. Get help right away if:  You lose feeling in the injured area. Summary  A stress fracture is a small break or crack in a bone. A stress fracture can be fully broken (complete) or partially broken (incomplete).  This condition is caused by overuse or repetitive exercise, such as running.  The most common symptom of a stress fracture is feeling pain when you are using or putting weight on the affected part of your body.  Treatment depends on the severity of your stress fracture. This information is not intended to replace advice given to you by your health care provider. Make sure you discuss any questions you have with your health care provider. Document Revised: 06/18/2017 Document Reviewed: 06/18/2017 Elsevier Patient Education  2021 [employer]  

## 2020-10-25 ENCOUNTER — Ambulatory Visit (INDEPENDENT_AMBULATORY_CARE_PROVIDER_SITE_OTHER): Payer: Medicare Other

## 2020-10-25 DIAGNOSIS — M19072 Primary osteoarthritis, left ankle and foot: Secondary | ICD-10-CM | POA: Diagnosis not present

## 2020-10-25 DIAGNOSIS — M79605 Pain in left leg: Secondary | ICD-10-CM

## 2020-10-25 DIAGNOSIS — M1712 Unilateral primary osteoarthritis, left knee: Secondary | ICD-10-CM | POA: Diagnosis not present

## 2020-11-02 ENCOUNTER — Ambulatory Visit (INDEPENDENT_AMBULATORY_CARE_PROVIDER_SITE_OTHER): Payer: Medicare Other

## 2020-11-02 VITALS — Ht 61.0 in | Wt 129.0 lb

## 2020-11-02 DIAGNOSIS — Z0000 Encounter for general adult medical examination without abnormal findings: Secondary | ICD-10-CM

## 2020-11-02 NOTE — Patient Instructions (Signed)
[name] , Thank you for taking time to come for your Medicare Wellness Visit. I appreciate your ongoing commitment to your health goals. Please review the following plan we discussed and let me know if I can assist you in the future.   Screening recommendations/referrals: Colonoscopy: Cologuard done 03/02/2019 - Repeat in 3 years if PCP recommends Mammogram: Done 04/27/2020 - Repeat annually Bone Density: Done 06/22/2019 - Repeat every 2 years Recommended yearly ophthalmology/optometry visit for glaucoma screening and checkup Recommended yearly dental visit for hygiene and checkup  Vaccinations: Influenza vaccine: Done 02/17/2020 - Repeat annually Pneumococcal vaccine: Done 03/09/2016 & 03/31/2018 Tdap vaccine: Done 02/17/2019 - Repeat in 10 years Shingles vaccine: Done 01/23/2017 & 04/30/2017   Covid-19:Done 06/11/2019, 07/17/2019, & 03/16/2020  Conditions/risks identified: Keep up the great work!  Aim for 30 minutes of exercise or brisk walking each day, drink 6-8 glasses of water and eat lots of fruits and vegetables.  Next appointment: Follow up in one year for your annual wellness visit    Preventive Care 65 Years and Older, Female Preventive care refers to lifestyle choices and visits with your health care provider that can promote health and wellness. What does preventive care include? A yearly physical exam. This is also called an annual well check. Dental exams once or twice a year. Routine eye exams. Ask your health care provider how often you should have your eyes checked. Personal lifestyle choices, including: Daily care of your teeth and gums. Regular physical activity. Eating a healthy diet. Avoiding tobacco and drug use. Limiting alcohol use. Practicing safe sex. Taking low-dose aspirin every day. Taking vitamin and mineral supplements as recommended by your health care provider. What happens during an annual well check? The services and screenings done by your health  care provider during your annual well check will depend on your age, overall health, lifestyle risk factors, and family history of disease. Counseling  Your health care provider may ask you questions about your: Alcohol use. Tobacco use. Drug use. Emotional well-being. Home and relationship well-being. Sexual activity. Eating habits. History of falls. Memory and ability to understand (cognition). Work and work Astronomer. Reproductive health. Screening  You may have the following tests or measurements: Height, weight, and BMI. Blood pressure. Lipid and cholesterol levels. These may be checked every 5 years, or more frequently if you are over 45 years old. Skin check. Lung cancer screening. You may have this screening every year starting at age 27 if you have a 30-pack-year history of smoking and currently smoke or have quit within the past 15 years. Fecal occult blood test (FOBT) of the stool. You may have this test every year starting at age 4. Flexible sigmoidoscopy or colonoscopy. You may have a sigmoidoscopy every 5 years or a colonoscopy every 10 years starting at age 5. Hepatitis C blood test. Hepatitis B blood test. Sexually transmitted disease (STD) testing. Diabetes screening. This is done by checking your blood sugar (glucose) after you have not eaten for a while (fasting). You may have this done every 1-3 years. Bone density scan. This is done to screen for osteoporosis. You may have this done starting at age 91. Mammogram. This may be done every 1-2 years. Talk to your health care provider about how often you should have regular mammograms. Talk with your health care provider about your test results, treatment options, and if necessary, the need for more tests. Vaccines  Your health care provider may recommend certain vaccines, such as: Influenza vaccine. This is  recommended every year. Tetanus, diphtheria, and acellular pertussis (Tdap, Td) vaccine. You may need a Td  booster every 10 years. Zoster vaccine. You may need this after age 38. Pneumococcal 13-valent conjugate (PCV13) vaccine. One dose is recommended after age 74. Pneumococcal polysaccharide (PPSV23) vaccine. One dose is recommended after age 39. Talk to your health care provider about which screenings and vaccines you need and how often you need them. This information is not intended to replace advice given to you by your health care provider. Make sure you discuss any questions you have with your health care provider. Document Released: 06/03/2015 Document Revised: 01/25/2016 Document Reviewed: 03/08/2015 Elsevier Interactive Patient Education  2017 [place] Prevention in the Home Falls can cause injuries. They can happen to people of all ages. There are many things you can do to make your home safe and to help prevent falls. What can I do on the outside of my home? Regularly fix the edges of walkways and driveways and fix any cracks. Remove anything that might make you trip as you walk through a door, such as a raised step or threshold. Trim any bushes or trees on the path to your home. Use bright outdoor lighting. Clear any walking paths of anything that might make someone trip, such as rocks or tools. Regularly check to see if handrails are loose or broken. Make sure that both sides of any steps have handrails. Any raised decks and porches should have guardrails on the edges. Have any leaves, snow, or ice cleared regularly. Use sand or salt on walking paths during winter. Clean up any spills in your garage right away. This includes oil or grease spills. What can I do in the bathroom? Use night lights. Install grab bars by the toilet and in the tub and shower. Do not use towel bars as grab bars. Use non-skid mats or decals in the tub or shower. If you need to sit down in the shower, use a plastic, non-slip stool. Keep the floor dry. Clean up any water that spills on the floor  as soon as it happens. Remove soap buildup in the tub or shower regularly. Attach bath mats securely with double-sided non-slip rug tape. Do not have throw rugs and other things on the floor that can make you trip. What can I do in the bedroom? Use night lights. Make sure that you have a light by your bed that is easy to reach. Do not use any sheets or blankets that are too big for your bed. They should not hang down onto the floor. Have a firm chair that has side arms. You can use this for support while you get dressed. Do not have throw rugs and other things on the floor that can make you trip. What can I do in the kitchen? Clean up any spills right away. Avoid walking on wet floors. Keep items that you use a lot in easy-to-reach places. If you need to reach something above you, use a strong step stool that has a grab bar. Keep electrical cords out of the way. Do not use floor polish or wax that makes floors slippery. If you must use wax, use non-skid floor wax. Do not have throw rugs and other things on the floor that can make you trip. What can I do with my stairs? Do not leave any items on the stairs. Make sure that there are handrails on both sides of the stairs and use them. Fix handrails that are  broken or loose. Make sure that handrails are as long as the stairways. Check any carpeting to make sure that it is firmly attached to the stairs. Fix any carpet that is loose or worn. Avoid having throw rugs at the top or bottom of the stairs. If you do have throw rugs, attach them to the floor with carpet tape. Make sure that you have a light switch at the top of the stairs and the bottom of the stairs. If you do not have them, ask someone to add them for you. What else can I do to help prevent falls? Wear shoes that: Do not have high heels. Have rubber bottoms. Are comfortable and fit you well. Are closed at the toe. Do not wear sandals. If you use a stepladder: Make sure that it is  fully opened. Do not climb a closed stepladder. Make sure that both sides of the stepladder are locked into place. Ask someone to hold it for you, if possible. Clearly mark and make sure that you can see: Any grab bars or handrails. First and last steps. Where the edge of each step is. Use tools that help you move around (mobility aids) if they are needed. These include: Canes. Walkers. Scooters. Crutches. Turn on the lights when you go into a dark area. Replace any light bulbs as soon as they burn out. Set up your furniture so you have a clear path. Avoid moving your furniture around. If any of your floors are uneven, fix them. If there are any pets around you, be aware of where they are. Review your medicines with your doctor. Some medicines can make you feel dizzy. This can increase your chance of falling. Ask your doctor what other things that you can do to help prevent falls. This information is not intended to replace advice given to you by your health care provider. Make sure you discuss any questions you have with your health care provider. Document Released: 03/03/2009 Document Revised: 10/13/2015 Document Reviewed: 06/11/2014 Elsevier Interactive Patient Education  2017 [employer].

## 2020-11-02 NOTE — Progress Notes (Signed)
Subjective:   Ann Ann Bolton S Ann Bolton is a 75 y.o. female who presents for Medicare Annual (Subsequent) preventive examination.  Virtual Visit via Telephone Note  I connected with  Ann Ann Bolton on 11/02/20 at 11:15 AM EDT by telephone and verified that I am speaking with the correct person using two identifiers.  Location: Patient: Home Provider: [facility] Persons participating in the virtual visit: patient/Nurse Health Advisor   I discussed the limitations, risks, security and privacy concerns of performing an evaluation and management service by telephone and the availability of in person appointments. The patient expressed understanding and agreed to proceed.  Interactive audio and video telecommunications were attempted between this nurse and patient, however failed, due to patient having technical difficulties OR patient did not have access to video capability.  We continued and completed visit with audio only.  Some vital signs may be absent or patient reported.   Tajah Schreiner E Andrue Dini, LPN   Review of Systems     Cardiac Risk Factors include: advanced age (>3955men, 66>65 women);dyslipidemia;hypertension     Objective:    Today's Vitals   11/02/20 1128  Weight: 129 lb (58.5 kg)  Height: 5\' 1"  (1.549 m)  PainSc: 3    Body mass index is 24.37 kg/m.  Advanced Directives 11/02/2019 10/17/2018  Does Patient Have a Medical Advance Directive? Yes Yes  Type of Estate agentAdvance Directive Healthcare Power of [place];Living will;Out of facility DNR (pink MOST or yellow form) Healthcare Power of [place];Living will  Does patient want to make changes to medical advance directive? No - Patient declined No - Patient declined  Copy of Healthcare Power of Attorney in Chart? Yes - validated most recent copy scanned in chart (See row information) No - copy requested    Current Medications (verified) Outpatient Encounter Medications as of 11/02/2020  Medication Sig   Calcium 600-200 MG-UNIT tablet Take 1 tablet  by mouth daily.   cholecalciferol (VITAMIN D) 1000 units tablet Take 1,000 Units by mouth daily.   ibandronate (BONIVA) 150 MG tablet Take 1 tablet (150 mg total) by mouth every 30 (thirty) days. Take in the morning with a full glass of water, on an empty stomach, and do not take anything else by mouth or lie down for the next 30 min.   lisinopril (ZESTRIL) 10 MG tablet Take 1 tablet (10 mg total) by mouth daily.   multivitamin-iron-minerals-folic acid (CENTRUM) chewable tablet Chew 1 tablet by mouth daily.   pravastatin (PRAVACHOL) 20 MG tablet Take 1 tablet (20 mg total) by mouth daily. [facility] supply   vitamin C (ASCORBIC ACID) 500 MG tablet Take 500 mg by mouth daily.   vitamin k 100 MCG tablet Take 100 mcg by mouth daily.   XIIDRA 5 % SOLN Place 1 drop into both eyes 2 (two) times daily.    [DISCONTINUED] pravastatin (PRAVACHOL) 20 MG tablet Take 1 tablet (20 mg total) by mouth daily.   No facility-administered encounter medications on file as of 11/02/2020.    Allergies (verified) Patient has no known allergies.   History: Past Medical History:  Diagnosis Date   Allergy    DDD (degenerative disc disease), lumbar    Hyperlipidemia    Hypertension    Osteoporosis    Past Surgical History:  Procedure Laterality Date   CATARACT EXTRACTION, BILATERAL     Family History  Problem Relation Age of Onset   Cancer Mother        Brain and lung    Hypertension Mother  Heart disease Father    Other Brother        died in war    Allergies Daughter    Menstrual problems Daughter    Breast cancer Daughter    Allergies Son    Heart disease Maternal Grandmother    Leukemia Maternal Grandfather    Dementia Paternal Grandmother    Other Paternal Grandfather        diptheria    Mental illness Brother    Obesity Brother    Allergies Son    Social History   Socioeconomic History   Marital status: Widowed    Spouse name: Not on file   Number of children: 3   Years of education:  Not on file   Highest education level: Not on file  Occupational History   Occupation: retired    Comment: banking   Tobacco Use   Smoking status: Never   Smokeless tobacco: Never  Vaping Use   Vaping Use: Never used  Substance and Sexual Activity   Alcohol use: Yes    Comment: occ   Drug use: No   Sexual activity: Not on file  Other Topics Concern   Not on file  Social History Narrative   She lives alone - one level. 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   Social Determinants of Health   Financial Resource Strain: Low Risk    Difficulty of Paying Living Expenses: Not hard at all  Food Insecurity: No Food Insecurity   Worried About Programme researcher, broadcasting/film/video in the Last Year: Never true   Ran Out of Food in the Last Year: Never true  Transportation Needs: No Transportation Needs   Lack of Transportation (Medical): No   Lack of Transportation (Non-Medical): No  Physical Activity: Sufficiently Active   Days of Exercise per Week: 5 days   Minutes of Exercise per Session: 60 min  Stress: No Stress Concern Present   Feeling of Stress : Only a little  Social Connections: Moderately Integrated   Frequency of Communication with Friends and Family: More than three times a week   Frequency of Social Gatherings with Friends and Family: More than three times a week   Attends Religious Services: 1 to 4 times per year   Active Member of [employer] or Organizations: Yes   Attends Engineer, structural: More than 4 times per year   Marital Status: Divorced    Tobacco Counseling Counseling given: Not Answered   Clinical Intake:  Pre-visit preparation completed: Yes  Pain : 0-10 Pain Score: 3  Pain Type: Chronic pain Pain Location: Leg Pain Orientation: Left Pain Descriptors / Indicators: Sore, Aching, Discomfort Pain Onset: 1 to 4 weeks ago Pain Frequency:  Intermittent     BMI - recorded: 24.37 Nutritional Status: BMI of 19-24  Normal Nutritional Risks: None Diabetes: No  How often do you need to have someone help you when you read instructions, pamphlets, or other written materials from your doctor or pharmacy?: 1 - Never  Diabetic? No  Interpreter Needed?: No  Information entered by :: Evalene Vath, LPN   Activities of Daily Living In your present state of health, do you have any difficulty performing the following activities: 11/02/2020  Hearing? N  Vision? N  Difficulty concentrating or making decisions? N  Walking or climbing stairs? N  Dressing or bathing? N  Doing errands, shopping? N  Preparing Food and eating ? N  Using the Toilet? N  In the past six months, have you accidently leaked urine? Y  Do you have problems with loss of bowel control? N  Managing your Medications? N  Managing your Finances? N  Housekeeping or managing your Housekeeping? N  Some recent data might be hidden    Patient Care Team: Raliegh Ip, DO as PCP - General (Family Medicine)  Indicate any recent Medical Services you may have received from other than [facility] providers in the past year (date may be approximate).     Assessment:   This is a routine wellness examination for [facility].  Hearing/Vision screen Hearing Screening - Comments:: Denies hearing difficulties  Vision Screening - Comments:: Wears eyeglasses - up to date with annual eye exam at [facility] in [place]  Dietary issues and exercise activities discussed: Current Exercise Habits: Structured exercise class, Type of exercise: walking;yoga;strength training/weights, Time (Minutes): 60, Frequency (Times/Week): 5, Weekly Exercise (Minutes/Week): 300, Intensity: Moderate, Exercise limited by: None identified   Goals Addressed             This Visit's Progress    Prevent falls   On track    Stay active         Depression Screen PHQ 2/9 Scores 11/02/2020 10/24/2020  03/15/2020 02/17/2020 11/02/2019 02/17/2019 10/17/2018  PHQ - 2 Score 0 0 0 0 0 0 0  PHQ- 9 Score 0 2 1 - - - -    Fall Risk Fall Risk  11/02/2020 10/24/2020 03/15/2020 02/17/2020 11/02/2019  Falls in the past year? 0 0 0 0 0  Number falls in past yr: 0 0 - - -  Injury with Fall? 0 0 - - -  Risk for fall due to : Impaired vision;Orthopedic patient - - - -  Follow up Falls prevention discussed Falls evaluation completed - - -    FALL RISK PREVENTION PERTAINING TO THE HOME:  Any stairs in or around the home? Yes  If so, are there any without handrails? Yes  porch stairs coming into front door without rails Home free of loose throw rugs in walkways, pet beds, electrical cords, etc? No  Adequate lighting in your home to reduce risk of falls? No   ASSISTIVE DEVICES UTILIZED TO PREVENT FALLS:  Life alert? No  Use of a cane, walker or w/c? No  Grab bars in the bathroom? Yes  Shower chair or bench in shower? No  Elevated toilet seat or a handicapped toilet? No   TIMED UP AND GO:  Was the test performed? No . Telephonic visit  Cognitive Function: Normal cognitive status assessed by direct observation by this Nurse Health Advisor. No abnormalities found.      6CIT Screen 11/02/2019 10/17/2018  What Year? 0 points 0 points  What month? 0 points 0 points  What time? 0 points 0 points  Count back from 20 0 points 0 points  Months in reverse 0 points 0 points  Repeat phrase 0 points 2 points  Total Score 0 2    Immunizations Immunization History  Administered Date(s) Administered   Fluad Quad(high Dose 65+) 01/24/2019, 02/17/2020   Influenza, High Dose Seasonal PF 03/09/2016, 02/25/2018   Influenza-Unspecified 02/25/2017, 01/27/2019   Moderna SARS-COV2 Booster Vaccination 03/16/2020   Moderna Sars-Covid-2 Vaccination 06/11/2019, 07/17/2019   Pneumococcal Conjugate-13 03/09/2016   Pneumococcal Polysaccharide-23 03/31/2018   Tdap 02/17/2019   Zoster Recombinat (Shingrix) 01/23/2017,  04/30/2017    TDAP status: Up to date  Flu Vaccine status: Up to date  Pneumococcal vaccine status: Up to date  Covid-19 vaccine status: Completed vaccines  Qualifies for Shingles Vaccine? Yes   Zostavax completed Yes   Shingrix Completed?: Yes  Screening Tests Health Maintenance  Topic Date Due   COVID-19 Vaccine (4 - Booster for Moderna series) 07/17/2020   INFLUENZA VACCINE  12/19/2020   MAMMOGRAM  04/27/2021   DEXA SCAN  06/21/2021   TETANUS/TDAP  02/16/2029   Hepatitis C Screening  Completed   PNA vac Low Risk Adult  Completed   Zoster Vaccines- Shingrix  Completed   HPV VACCINES  Aged Out    Health Maintenance  Health Maintenance Due  Topic Date Due   COVID-19 Vaccine (4 - Booster for Moderna series) 07/17/2020    Colorectal cancer screening: Type of screening: Cologuard. Completed 03/02/2019. Repeat every 3 years  Mammogram status: Completed 04/27/2020. Repeat every year  Bone Density status: Completed 04/20/2020. Results reflect: Bone density results: OSTEOPOROSIS. Repeat every 2 years.  Lung Cancer Screening: (Low Dose CT Chest recommended if Age 60-80 years, 30 pack-year currently smoking OR have quit w/in 15years.) does not qualify  Additional Screening:  Hepatitis C Screening: does qualify; Completed 03/09/2016  Vision Screening: Recommended annual ophthalmology exams for early detection of glaucoma and other disorders of the eye. Is the patient up to date with their annual eye exam?  Yes  Who is the provider or what is the name of the office in which the patient attends annual eye exams? Dr Conley Rolls If pt is not established with a provider, would they like to be referred to a provider to establish care? No .   Dental Screening: Recommended annual dental exams for proper oral hygiene  Community Resource Referral / Chronic Care Management: CRR required this visit?  No   CCM required this visit?  No      Plan:     I have personally reviewed and noted  the following in the patient's chart:   Medical and social history Use of alcohol, tobacco or illicit drugs  Current medications and supplements including opioid prescriptions.  Functional ability and status Nutritional status Physical activity Advanced directives List of other physicians Hospitalizations, surgeries, and ER visits in previous 12 months Vitals Screenings to include cognitive, depression, and falls Referrals and appointments  In addition, I have reviewed and discussed with patient certain preventive protocols, quality metrics, and best practice recommendations. A written personalized care plan for preventive services as well as general preventive health recommendations were provided to patient.     [place] Constable, LPN   [id number]   Nurse Notes: None

## 2020-12-26 DIAGNOSIS — H04123 Dry eye syndrome of bilateral lacrimal glands: Secondary | ICD-10-CM | POA: Diagnosis not present

## 2020-12-26 DIAGNOSIS — H40033 Anatomical narrow angle, bilateral: Secondary | ICD-10-CM | POA: Diagnosis not present

## 2021-02-17 ENCOUNTER — Ambulatory Visit (INDEPENDENT_AMBULATORY_CARE_PROVIDER_SITE_OTHER): Payer: Medicare Other | Admitting: Family Medicine

## 2021-02-17 ENCOUNTER — Other Ambulatory Visit: Payer: Self-pay

## 2021-02-17 ENCOUNTER — Encounter: Payer: Self-pay | Admitting: Family Medicine

## 2021-02-17 VITALS — BP 133/74 | HR 72 | Temp 98.0°F | Ht 61.0 in | Wt 128.2 lb

## 2021-02-17 DIAGNOSIS — S86892A Other injury of other muscle(s) and tendon(s) at lower leg level, left leg, initial encounter: Secondary | ICD-10-CM | POA: Diagnosis not present

## 2021-02-17 DIAGNOSIS — Z0000 Encounter for general adult medical examination without abnormal findings: Secondary | ICD-10-CM

## 2021-02-17 DIAGNOSIS — Z0001 Encounter for general adult medical examination with abnormal findings: Secondary | ICD-10-CM | POA: Diagnosis not present

## 2021-02-17 DIAGNOSIS — I10 Essential (primary) hypertension: Secondary | ICD-10-CM

## 2021-02-17 DIAGNOSIS — S86892S Other injury of other muscle(s) and tendon(s) at lower leg level, left leg, sequela: Secondary | ICD-10-CM

## 2021-02-17 DIAGNOSIS — Z23 Encounter for immunization: Secondary | ICD-10-CM | POA: Diagnosis not present

## 2021-02-17 DIAGNOSIS — M810 Age-related osteoporosis without current pathological fracture: Secondary | ICD-10-CM | POA: Diagnosis not present

## 2021-02-17 DIAGNOSIS — R7989 Other specified abnormal findings of blood chemistry: Secondary | ICD-10-CM | POA: Diagnosis not present

## 2021-02-17 DIAGNOSIS — E7800 Pure hypercholesterolemia, unspecified: Secondary | ICD-10-CM

## 2021-02-17 DIAGNOSIS — L603 Nail dystrophy: Secondary | ICD-10-CM

## 2021-02-17 MED ORDER — LISINOPRIL 10 MG PO TABS: 10.0000 mg | ORAL_TABLET | Freq: Every day | ORAL | 3 refills | Status: DC

## 2021-02-17 MED ORDER — ALENDRONATE SODIUM 70 MG PO TABS: 70.0000 mg | ORAL_TABLET | ORAL | 3 refills | Status: DC

## 2021-02-17 MED ORDER — PRAVASTATIN SODIUM 20 MG PO TABS: 20.0000 mg | ORAL_TABLET | Freq: Every day | ORAL | 3 refills | Status: DC

## 2021-02-17 NOTE — Progress Notes (Signed)
Ann Bolton is a 75 y.o. female presents to office today for annual physical exam examination.    Concerns today include: 1.  Shin splint Patient reports that she has been having shinsplints on the left lower leg.  She was seen several months ago for this after she started working out again.  She had an x-ray performed and there was no evidence of stress fracture, etc.  Symptoms do seem to be getting better and she would say that she is about 99% better.  Still has some intermittent tenderness to palpation to the anterior shin and she is been utilizing Tylenol arthritis with fairly good relief.  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  She is really pleased with the new owners and the renovation of the building and equipment.  2.  Osteoporosis She has consider switching over to Fosamax again.  She feels that the GI symptoms that she experienced with this previously may have been simply because she did not know what to expect.  She is tolerating the Boniva without difficulty but she has considered doing some dental work and it would be easier to discontinue the Fosamax temporarily rather than the Boniva.  3.  Hypertension with hyperlipidemia Compliant with lisinopril and Pravachol.  No chest pain, shortness of breath, change in exercise tolerance.  She had a good eye checkup and was told recently that her astigmatism seem to be resolving  4.  Nail striations Patient reports nail striations for many years.  She is asked multiple providers why these occur but has never really been given a good answer.  She does not feel that she is iron deficient she takes multivitamins and eats a balanced diet.  She denies any pain but feels self-conscious about it.  Occupation: Art gallery manager, Substance use: none Diet: balanced, Exercise: regular Last colonoscopy: UTD Last mammogram: UTD Last pap smear: UTD. N/a >88 yo Refills needed today: none Immunizations  needed: Immunization History  Administered Date(s) Administered   Fluad Quad(high Dose 65+) 01/24/2019, 02/17/2020   Influenza, High Dose Seasonal PF 03/09/2016, 02/25/2018   Influenza-Unspecified 02/25/2017, 01/27/2019   Moderna SARS-COV2 Booster Vaccination 03/16/2020   Moderna Sars-Covid-2 Vaccination 06/11/2019, 07/17/2019, 11/08/2020   Pneumococcal Conjugate-13 03/09/2016   Pneumococcal Polysaccharide-23 03/31/2018   Tdap 02/17/2019   Zoster Recombinat (Shingrix) 01/23/2017, 04/30/2017     Past Medical History:  Diagnosis Date   Allergy    DDD (degenerative disc disease), lumbar    Hyperlipidemia    Hypertension    Osteoporosis    Social History   Socioeconomic History   Marital status: Widowed    Spouse name: Not on file   Number of children: 3   Years of education: Not on file   Highest education level: Not on file  Occupational History   Occupation: retired    Comment: banking   Tobacco Use   Smoking status: Never   Smokeless tobacco: Never  Vaping Use   Vaping Use: Never used  Substance and Sexual Activity   Alcohol use: Yes    Comment: occ   Drug use: No   Sexual activity: Not on file  Other Topics Concern   Not on file  Social History Narrative   She lives alone - one level. 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   Social Determinants of Health   Financial Resource Strain:  Low Risk    Difficulty of Paying Living Expenses: Not hard at all  Food Insecurity: No Food Insecurity   Worried About Running Out of Food in the Last Year: Never true   Ran Out of Food in the Last Year: Never true  Transportation Needs: No Transportation Needs   Lack of Transportation (Medical): No   Lack of Transportation (Non-Medical): No  Physical Activity: Sufficiently Active   Days of Exercise per Week: 5 days   Minutes of Exercise per Session: 60 min   Stress: No Stress Concern Present   Feeling of Stress : Only a little  Social Connections: Moderately Integrated   Frequency of Communication with Friends and Family: More than three times a week   Frequency of Social Gatherings with Friends and Family: More than three times a week   Attends Religious Services: 1 to 4 times per year   Active Member of [employer] or Organizations: Yes   Attends Music therapist: More than 4 times per year   Marital Status: Divorced  Human resources officer Violence: Not At Risk   Fear of Current or Ex-Partner: No   Emotionally Abused: No   Physically Abused: No   Sexually Abused: No   Past Surgical History:  Procedure Laterality Date   CATARACT EXTRACTION, BILATERAL     Family History  Problem Relation Age of Onset   Cancer Mother        Brain and lung    Hypertension Mother    Heart disease Father    Other Brother        died in war    Allergies Daughter    Menstrual problems Daughter    Breast cancer Daughter    Allergies Son    Heart disease Maternal Grandmother    Leukemia Maternal Grandfather    Dementia Paternal Grandmother    Other Paternal Grandfather        diptheria    Mental illness Brother    Obesity Brother    Allergies Son     Current Outpatient Medications:    Calcium 600-200 MG-UNIT tablet, Take 1 tablet by mouth daily., Disp: , Rfl:    cholecalciferol (VITAMIN D) 1000 units tablet, Take 1,000 Units by mouth daily., Disp: , Rfl:    ibandronate (BONIVA) 150 MG tablet, Take 1 tablet (150 mg total) by mouth every 30 (thirty) days. Take in the morning with a full glass of water, on an empty stomach, and do not take anything else by mouth or lie down for the next 30 min., Disp: 3 tablet, Rfl: prn   lisinopril (ZESTRIL) 10 MG tablet, Take 1 tablet (10 mg total) by mouth daily., Disp: 90 tablet, Rfl: 3   multivitamin-iron-minerals-folic acid (CENTRUM) chewable tablet, Chew 1 tablet by mouth daily., Disp: , Rfl:    pravastatin  (PRAVACHOL) 20 MG tablet, Take 1 tablet (20 mg total) by mouth daily. Bridge supply, Disp: 30 tablet, Rfl: 0   vitamin C (ASCORBIC ACID) 500 MG tablet, Take 500 mg by mouth daily., Disp: , Rfl:    vitamin k 100 MCG tablet, Take 100 mcg by mouth daily., Disp: , Rfl:    XIIDRA 5 % SOLN, Place 1 drop into both eyes 2 (two) times daily. , Disp: , Rfl:   No Known Allergies   ROS: Review of Systems Pertinent items noted in HPI and remainder of comprehensive ROS otherwise negative.    Physical exam BP 133/74   Pulse 72   Temp 98 F (36.7  C) (Temporal)   Ht 5' 1"  (1.549 m)   Wt 128 lb 3.2 oz (58.2 kg)   SpO2 98%   BMI 24.22 kg/m  General appearance: alert, cooperative, appears stated age, and no distress Head: Normocephalic, without obvious abnormality, atraumatic Eyes: negative findings: lids and lashes normal, conjunctivae and sclerae normal, corneas clear, and pupils equal, round, reactive to light and accomodation Ears: normal TM's and external ear canals both ears Nose: Nares normal. Septum midline. Mucosa normal. No drainage or sinus tenderness. Throat: lips, mucosa, and tongue normal; teeth and gums normal Neck: no adenopathy, no carotid bruit, supple, symmetrical, trachea midline, and thyroid not enlarged, symmetric, no tenderness/mass/nodules Back: symmetric, no curvature. ROM normal. No CVA tenderness. Lungs: clear to auscultation bilaterally Heart: regular rate and rhythm, S1, S2 normal, no murmur, click, rub or gallop Abdomen: soft, non-tender; bowel sounds normal; no masses,  no organomegaly Extremities: extremities normal, atraumatic, no cyanosis or edema Pulses: 2+ and symmetric Skin: Skin color, texture, turgor normal. No rashes or lesions Lymph nodes: Cervical, supraclavicular, and axillary nodes normal. Neurologic: Alert and oriented X 3, normal strength and tone. Normal symmetric reflexes. Normal coordination and gait Nails: with longitudinal ridging. No spoon  deformity/ no pitting. No pigmented striations.  Assessment/ Plan: Ann Bolton here for annual physical exam.   Annual physical exam  Age-related osteoporosis without current pathological fracture - Plan: CMP14+EGFR, CBC, VITAMIN D 25 Hydroxy (Vit-D Deficiency, Fractures), alendronate (FOSAMAX) 70 MG tablet  Abnormal TSH - Plan: TSH, T4, free, CBC  Pure hypercholesterolemia - Plan: CMP14+EGFR, Lipid panel, CBC, pravastatin (PRAVACHOL) 20 MG tablet  Essential hypertension - Plan: CMP14+EGFR, CBC, lisinopril (ZESTRIL) 10 MG tablet  Need for immunization against influenza - Plan: Flu Vaccine QUAD High Dose(Fluad)  Onychorrhexis  Left medial tibial stress syndrome, sequela  Up-to-date on preventative care.  I have switched her from Boniva to Fosamax.  Plan for vitamin D and CMP check with fasting labs  Check TSH, free T4.  Check fasting lipid panel, CMP.  Continue statin.  This has been renewed  Blood pressure well controlled.  No changes  Influenza vaccination administered  Nail deformities is consistent with onychorrhexis.  She had nothing pigmented or any other concerning symptoms or signs to suggest pathology.  Has had some abnormal TSH in the past and we will recheck that as above.  However, I anticipate that this may simply be some type of vitamin deficiency.  Encouraged a hair skin and nail vitamin.  Could consider addition of folic acid but I suspect this is in her Centrum Silver.  Encourage supportive footwear for her shinsplints.  We discussed consideration for evaluation by Dr. Oneida Alar or Dr. Micheline Chapman in [place] if she continues to have symptoms or issues.  Might benefit from gait evaluation and possible custom orthotics.  She voiced good understanding will contact me if this is needed  Patient to follow up in 1 year for annual exam or sooner if needed.  Ann Bolton M. Lajuana Ripple, DO

## 2021-02-17 NOTE — Patient Instructions (Signed)
Not due for Cologuard until 02/2022.  We'll order it next year during your physical  Let me know if you want to see Dr Darrick Penna in [facility]  Come in for fasting labs.  Consider adding Biotin to your supplements for your nails  [facility] Cristal Splints Rehab Ask your health care provider which exercises are safe for you. Do exercises exactly as told by your health care provider and adjust them as directed. It is normal to feel mild stretching, pulling, tightness, or discomfort as you do these exercises. Stop right away if you feel sudden pain or your pain gets worse. Do not begin these exercises until told by your health care provider. Stretching and range-of-motion exercise This exercise warms up your muscles and joints and improves the movement and flexibility of your lower leg. This exercise also helps to relieve pain. Calf stretch, standing  Stand with the ball of your left / right foot on a step. The ball of your foot is on the walking surface, right under your toes. Keep your other foot firmly on the same step. Hold on to the wall, a railing, or a chair for balance. Slowly lift your other foot, allowing your body weight to press your left / right heel down over the edge of the step. You should feel a stretch in your left / right calf. Hold this position for __________ seconds. Repeat this exercise with a slight bend in your left / right knee. Repeat __________ times with your left / right knee straight and __________ times with your left / right knee bent. Complete this exercise __________ times a day. Strengthening exercises These exercises build strength and endurance in your lower leg. Endurance is the ability to use your muscles for a long time, even after they get tired. Dorsiflexion with band  Secure a rubber exercise band or tubing to a fixed object, such as a table leg or a pole. Secure the other end of the band around your left / right foot. Sit on the floor, facing the fixed object with your  left / right leg extended. The band should be slightly tense when your foot is relaxed. Slowly use your ankle muscles to pull your foot toward you (dorsiflexion). Hold this position for __________ seconds. Slowly release the tension in the band and return your foot to the starting position. Repeat __________ times. Complete this exercise __________ times a day. Ankle eversion with band Secure one end of a rubber exercise band or tubing to a fixed object, such as a table leg or a pole, that will stay in place when the band is pulled. Loop the other end of the band around the middle of your left / right foot. Sit on the floor, facing the fixed object. The band should be slightly tense when your foot is relaxed. Make fists with your hands and put them between your knees. This will focus your strengthening at your ankle. Leading with your little toe, slowly push your banded foot outward, away from your other leg (eversion). Make sure the band is positioned to resist the entire motion. Hold this position for __________ seconds. Control the tension in the band as you slowly return your foot to the starting position. Repeat __________ times. Complete this exercise __________ times a day. Ankle inversion with band Secure one end of a rubber exercise band or tubing to a fixed object, such as a table leg or a pole, that will stay in place when the band is pulled. Loop the other  end of the band around your left / right foot, just below your toes. Sit on the floor, facing the fixed object. The band should be slightly tense when your foot is relaxed. Make fists with your hands and put them between your knees. This will focus your strengthening at your ankle. Leading with your big toe, slowly pull your banded foot inward, toward your other leg (inversion). Make sure the band is positioned to resist the entire motion. Hold this position for __________ seconds. Control the tension in the band as you slowly  return your foot to the starting position. Repeat __________ times. Complete this exercise __________ times a day. Lateral walking with band This is an exercise in which you walk sideways (lateral), with tension provided by an exercise band. Stand in a long hallway. Wrap a loop of exercise band around your legs, just above your knees. Bend your knees gently and drop your hips down and back so your weight is over your heels. Step to the side to move down the length of the hallway, keeping your toes pointed forward and keeping tension in the band. Repeat, leading with your other leg. Repeat __________ times. Complete this exercise __________ times a day. Balance exercise This exercise will help improve your control of your foot and ankle when you are standing or walking. Single leg stance Without wearing shoes, stand near a railing or in a doorway. You may hold on to the railing or door frame as needed. Stand on your left / right foot. Keep your big toe down on the floor and try to keep your arch lifted. If this exercise is too easy, you can try doing it with your eyes closed or while standing on a pillow. Hold this position for __________ seconds. Repeat __________ times. Complete this exercise __________ times a day. This information is not intended to replace advice given to you by your health care provider. Make sure you discuss any questions you have with your health care provider. Document Revised: 08/29/2018 Document Reviewed: 03/18/2018 Elsevier Patient Education  2022 [employer].

## 2021-02-20 ENCOUNTER — Other Ambulatory Visit: Payer: Self-pay

## 2021-02-20 ENCOUNTER — Other Ambulatory Visit: Payer: Self-pay | Admitting: Family Medicine

## 2021-02-20 ENCOUNTER — Other Ambulatory Visit: Payer: Medicare Other

## 2021-02-20 DIAGNOSIS — I10 Essential (primary) hypertension: Secondary | ICD-10-CM | POA: Diagnosis not present

## 2021-02-20 DIAGNOSIS — E7800 Pure hypercholesterolemia, unspecified: Secondary | ICD-10-CM | POA: Diagnosis not present

## 2021-02-20 DIAGNOSIS — M810 Age-related osteoporosis without current pathological fracture: Secondary | ICD-10-CM

## 2021-02-20 DIAGNOSIS — R7989 Other specified abnormal findings of blood chemistry: Secondary | ICD-10-CM | POA: Diagnosis not present

## 2021-02-20 DIAGNOSIS — Z1231 Encounter for screening mammogram for malignant neoplasm of breast: Secondary | ICD-10-CM

## 2021-02-21 LAB — TSH: TSH: 5.64 u[IU]/mL — ABNORMAL HIGH (ref 0.450–4.500)

## 2021-02-21 LAB — CMP14+EGFR
ALT: 5 IU/L (ref 0–32)
AST: 21 IU/L (ref 0–40)
Albumin/Globulin Ratio: 2.3 — ABNORMAL HIGH (ref 1.2–2.2)
Albumin: 4.5 g/dL (ref 3.7–4.7)
Alkaline Phosphatase: 67 IU/L (ref 44–121)
BUN/Creatinine Ratio: 10 — ABNORMAL LOW (ref 12–28)
BUN: 9 mg/dL (ref 8–27)
Bilirubin Total: 0.7 mg/dL (ref 0.0–1.2)
CO2: 25 mmol/L (ref 20–29)
Calcium: 9.8 mg/dL (ref 8.7–10.3)
Chloride: 102 mmol/L (ref 96–106)
Creatinine, Ser: 0.88 mg/dL (ref 0.57–1.00)
Globulin, Total: 2 g/dL (ref 1.5–4.5)
Glucose: 96 mg/dL (ref 70–99)
Potassium: 4.6 mmol/L (ref 3.5–5.2)
Sodium: 141 mmol/L (ref 134–144)
Total Protein: 6.5 g/dL (ref 6.0–8.5)
eGFR: 68 mL/min/{1.73_m2} (ref >59)

## 2021-02-21 LAB — VITAMIN D 25 HYDROXY (VIT D DEFICIENCY, FRACTURES): Vit D, 25-Hydroxy: 94.1 ng/mL (ref 30.0–100.0)

## 2021-02-21 LAB — CBC
Hematocrit: 43 % (ref 34.0–46.6)
Hemoglobin: 14.6 g/dL (ref 11.1–15.9)
MCH: 32.4 pg (ref 26.6–33.0)
MCHC: 34 g/dL (ref 31.5–35.7)
MCV: 95 fL (ref 79–97)
Platelets: 219 10*3/uL (ref 150–450)
RBC: 4.51 x10E6/uL (ref 3.77–5.28)
RDW: 12.7 % (ref 11.7–15.4)
WBC: 5.3 10*3/uL (ref 3.4–10.8)

## 2021-02-21 LAB — LIPID PANEL
Chol/HDL Ratio: 3.2 ratio (ref 0.0–4.4)
Cholesterol, Total: 181 mg/dL (ref 100–199)
HDL: 57 mg/dL (ref >39)
LDL Chol Calc (NIH): 100 mg/dL — ABNORMAL HIGH (ref 0–99)
Triglycerides: 140 mg/dL (ref 0–149)
VLDL Cholesterol Cal: 24 mg/dL (ref 5–40)

## 2021-02-21 LAB — T4, FREE: Free T4: 1.28 ng/dL (ref 0.82–1.77)

## 2021-02-22 MED ORDER — PRAVASTATIN SODIUM 40 MG PO TABS: 40.0000 mg | ORAL_TABLET | Freq: Every day | ORAL | 3 refills | Status: DC

## 2021-04-08 DIAGNOSIS — N390 Urinary tract infection, site not specified: Secondary | ICD-10-CM | POA: Insufficient documentation

## 2021-04-10 ENCOUNTER — Other Ambulatory Visit: Payer: Self-pay

## 2021-04-10 ENCOUNTER — Encounter: Payer: Self-pay | Admitting: Nurse Practitioner

## 2021-04-10 ENCOUNTER — Ambulatory Visit (INDEPENDENT_AMBULATORY_CARE_PROVIDER_SITE_OTHER): Payer: Medicare Other | Admitting: Nurse Practitioner

## 2021-04-10 VITALS — BP 151/86 | HR 99 | Temp 98.1°F | Resp 20 | Ht 61.0 in | Wt 127.0 lb

## 2021-04-10 DIAGNOSIS — N3000 Acute cystitis without hematuria: Secondary | ICD-10-CM | POA: Diagnosis not present

## 2021-04-10 DIAGNOSIS — R300 Dysuria: Secondary | ICD-10-CM

## 2021-04-10 LAB — URINALYSIS, COMPLETE
Bilirubin, UA: NEGATIVE
Glucose, UA: NEGATIVE
Ketones, UA: NEGATIVE
Nitrite, UA: NEGATIVE
Protein,UA: NEGATIVE
Specific Gravity, UA: 1.015 (ref 1.005–1.030)
Urobilinogen, Ur: 0.2 mg/dL (ref 0.2–1.0)
pH, UA: 7 (ref 5.0–7.5)

## 2021-04-10 LAB — MICROSCOPIC EXAMINATION
Renal Epithel, UA: NONE SEEN /hpf
WBC, UA: >30 /hpf — AB (ref 0–5)

## 2021-04-10 MED ORDER — FLUCONAZOLE 150 MG PO TABS: 150.0000 mg | ORAL_TABLET | Freq: Once | ORAL | 0 refills | Status: AC

## 2021-04-10 MED ORDER — SULFAMETHOXAZOLE-TRIMETHOPRIM 800-160 MG PO TABS: 1.0000 | ORAL_TABLET | Freq: Two times a day (BID) | ORAL | 0 refills | Status: DC

## 2021-04-10 NOTE — Patient Instructions (Signed)

## 2021-04-10 NOTE — Progress Notes (Signed)
Subjective:    Patient ID: Ann Bolton, female    DOB: [birth year], 75 y.o.   MRN: [id number]  Patient woke up in the middle of night Saturday mornings with UTI symptoms. She has been drinking a lot of water but symptoms have not completley resolved. No back pain and no abdominal pain.    Review of Systems  Constitutional:  Negative for diaphoresis.  Eyes:  Negative for pain.  Respiratory:  Negative for shortness of breath.   Cardiovascular:  Negative for chest pain, palpitations and leg swelling.  Gastrointestinal:  Negative for abdominal pain.  Endocrine: Negative for polydipsia.  Skin:  Negative for rash.  Neurological:  Negative for dizziness, weakness and headaches.  Hematological:  Does not bruise/bleed easily.  All other systems reviewed and are negative.     Objective:   Physical Exam Vitals and nursing note reviewed.  Constitutional:      General: She is not in acute distress.    Appearance: Normal appearance. She is well-developed.  Neck:     Vascular: No carotid bruit or JVD.  Cardiovascular:     Rate and Rhythm: Normal rate and regular rhythm.     Heart sounds: Normal heart sounds.  Pulmonary:     Effort: Pulmonary effort is normal. No respiratory distress.     Breath sounds: Normal breath sounds. No wheezing or rales.  Chest:     Chest wall: No tenderness.  Abdominal:     General: Bowel sounds are normal. There is no distension or abdominal bruit.     Palpations: Abdomen is soft. There is no hepatomegaly, splenomegaly, mass or pulsatile mass.     Tenderness: There is no abdominal tenderness. There is no right CVA tenderness or left CVA tenderness.  Musculoskeletal:        General: Normal range of motion.     Cervical back: Normal range of motion and neck supple.  Lymphadenopathy:     Cervical: No cervical adenopathy.  Skin:    General: Skin is warm and dry.  Neurological:     Mental Status: She is alert and oriented to person, place, and time.      Deep Tendon Reflexes: Reflexes are normal and symmetric.  Psychiatric:        Behavior: Behavior normal.        Thought Content: Thought content normal.        Judgment: Judgment normal.   BP (!) 151/86   Pulse 99   Temp 98.1 F (36.7 C) (Temporal)   Resp 20   Ht 5\' 1"  (1.549 m)   Wt 127 lb (57.6 kg)   SpO2 (!) 8%   BMI 24.00 kg/m   UA 2+ leuks and >30 WBC       Assessment & Plan:  in today with chief complaint of Dysuria   1. Dysuria - Urinalysis, Complete  2. Acute cystitis without hematuria Take medication as prescribe Cotton underwear Take shower not bath Cranberry juice, yogurt Force fluids AZO over the counter X2 days Culture pending RTO prn  - sulfamethoxazole-trimethoprim (BACTRIM DS) 800-160 MG tablet; Take 1 tablet by mouth 2 (two) times daily.  Dispense: 14 tablet; Refill: 0 - fluconazole (DIFLUCAN) 150 MG tablet; Take 1 tablet (150 mg total) by mouth once for 1 dose.  Dispense: 1 tablet; Refill: 0    The above assessment and management plan was discussed with the patient. The patient verbalized understanding of and has agreed to the management plan. Patient is  aware to call the clinic if symptoms persist or worsen. Patient is aware when to return to the clinic for a follow-up visit. Patient educated on when it is appropriate to go to the emergency department.   Mary-Margaret Hassell Done, FNP

## 2021-04-12 LAB — URINE CULTURE

## 2021-05-17 ENCOUNTER — Ambulatory Visit
Admission: RE | Admit: 2021-05-17 | Discharge: 2021-05-17 | Disposition: A | Discharge status: Home / Self Care | Payer: Medicare Other | Source: Ambulatory Visit | Attending: Family Medicine | Admitting: Family Medicine

## 2021-05-17 DIAGNOSIS — Z1231 Encounter for screening mammogram for malignant neoplasm of breast: Secondary | ICD-10-CM | POA: Diagnosis not present

## 2021-09-22 ENCOUNTER — Telehealth: Payer: Self-pay | Admitting: Family Medicine

## 2021-09-22 DIAGNOSIS — I10 Essential (primary) hypertension: Secondary | ICD-10-CM

## 2021-09-22 MED ORDER — LISINOPRIL 20 MG PO TABS: 20.0000 mg | ORAL_TABLET | Freq: Every day | ORAL | 3 refills | Status: DC

## 2021-09-22 NOTE — Telephone Encounter (Signed)
lmtcb

## 2021-09-22 NOTE — Telephone Encounter (Signed)
Ok to increase lisinopril to 20mg .  Take 2 of the 10mg  to equal this dose and I will send  a new rx for the 20mg  dose to the mail order pharmacy to transition over to for ease.  Would like her to continue monitoring BP at home and see me in 1 month for recheck. ?

## 2021-10-05 ENCOUNTER — Other Ambulatory Visit: Payer: Self-pay | Admitting: Family Medicine

## 2021-10-12 DIAGNOSIS — G5762 Lesion of plantar nerve, left lower limb: Secondary | ICD-10-CM | POA: Diagnosis not present

## 2021-10-12 DIAGNOSIS — M7742 Metatarsalgia, left foot: Secondary | ICD-10-CM | POA: Diagnosis not present

## 2021-10-31 ENCOUNTER — Telehealth: Payer: Self-pay | Admitting: Family Medicine

## 2021-10-31 NOTE — Telephone Encounter (Signed)
Daughter confirmed that patient is taking 20 mg lisinopril

## 2021-10-31 NOTE — Telephone Encounter (Signed)
Patient calling to confirm she was taking Lisinopril

## 2021-11-03 ENCOUNTER — Ambulatory Visit (INDEPENDENT_AMBULATORY_CARE_PROVIDER_SITE_OTHER): Payer: Medicare Other

## 2021-11-03 VITALS — Wt 127.0 lb

## 2021-11-03 DIAGNOSIS — Z0000 Encounter for general adult medical examination without abnormal findings: Secondary | ICD-10-CM | POA: Diagnosis not present

## 2021-11-03 NOTE — Patient Instructions (Signed)
[name] , Thank you for taking time to come for your Medicare Wellness Visit. I appreciate your ongoing commitment to your health goals. Please review the following plan we discussed and let me know if I can assist you in the future.   Screening recommendations/referrals: Colonoscopy: Cologuard done 03/02/2019 - repeat every 3 years - Dr Nadine Counts will order at your physical this year. Mammogram: Done 05/17/2021 - Repeat annually  Bone Density: Done 06/22/2019 - Repeat every 2 years Recommended yearly ophthalmology/optometry visit for glaucoma screening and checkup Recommended yearly dental visit for hygiene and checkup  Vaccinations: Influenza vaccine: Done 02/17/2021 - Repeat annually  Pneumococcal vaccine: Done 03/09/2016 & 03/31/2018  Tdap vaccine: Done 02/17/2019 - Repeat in 10 years Shingles vaccine: Done  01/23/2017 & 12/1/ Covid-19:Done 06/11/2019, 07/17/2019, 03/16/2020, 11/08/2020, & 05/30/2021  Advanced directives: in chart  Conditions/risks identified: Keep up the great work staying active! Aim for 30 minutes of exercise or brisk walking, 6-8 glasses of water, and 5 servings of fruits and vegetables each day.   Next appointment: Follow up in one year for your annual wellness visit    Preventive Care 65 Years and Older, Female Preventive care refers to lifestyle choices and visits with your health care provider that can promote health and wellness. What does preventive care include? A yearly physical exam. This is also called an annual well check. Dental exams once or twice a year. Routine eye exams. Ask your health care provider how often you should have your eyes checked. Personal lifestyle choices, including: Daily care of your teeth and gums. Regular physical activity. Eating a healthy diet. Avoiding tobacco and drug use. Limiting alcohol use. Practicing safe sex. Taking low-dose aspirin every day. Taking vitamin and mineral supplements as recommended by your health care  provider. What happens during an annual well check? The services and screenings done by your health care provider during your annual well check will depend on your age, overall health, lifestyle risk factors, and family history of disease. Counseling  Your health care provider may ask you questions about your: Alcohol use. Tobacco use. Drug use. Emotional well-being. Home and relationship well-being. Sexual activity. Eating habits. History of falls. Memory and ability to understand (cognition). Work and work Astronomer. Reproductive health. Screening  You may have the following tests or measurements: Height, weight, and BMI. Blood pressure. Lipid and cholesterol levels. These may be checked every 5 years, or more frequently if you are over 62 years old. Skin check. Lung cancer screening. You may have this screening every year starting at age 2 if you have a 30-pack-year history of smoking and currently smoke or have quit within the past 15 years. Fecal occult blood test (FOBT) of the stool. You may have this test every year starting at age 69. Flexible sigmoidoscopy or colonoscopy. You may have a sigmoidoscopy every 5 years or a colonoscopy every 10 years starting at age 12. Hepatitis C blood test. Hepatitis B blood test. Sexually transmitted disease (STD) testing. Diabetes screening. This is done by checking your blood sugar (glucose) after you have not eaten for a while (fasting). You may have this done every 1-3 years. Bone density scan. This is done to screen for osteoporosis. You may have this done starting at age 53. Mammogram. This may be done every 1-2 years. Talk to your health care provider about how often you should have regular mammograms. Talk with your health care provider about your test results, treatment options, and if necessary, the need for more  tests. Vaccines  Your health care provider may recommend certain vaccines, such as: Influenza vaccine. This is  recommended every year. Tetanus, diphtheria, and acellular pertussis (Tdap, Td) vaccine. You may need a Td booster every 10 years. Zoster vaccine. You may need this after age 97. Pneumococcal 13-valent conjugate (PCV13) vaccine. One dose is recommended after age 93. Pneumococcal polysaccharide (PPSV23) vaccine. One dose is recommended after age 83. Talk to your health care provider about which screenings and vaccines you need and how often you need them. This information is not intended to replace advice given to you by your health care provider. Make sure you discuss any questions you have with your health care provider. Document Released: 06/03/2015 Document Revised: 01/25/2016 Document Reviewed: 03/08/2015 Elsevier Interactive Patient Education  2017 [place] Prevention in the Home Falls can cause injuries. They can happen to people of all ages. There are many things you can do to make your home safe and to help prevent falls. What can I do on the outside of my home? Regularly fix the edges of walkways and driveways and fix any cracks. Remove anything that might make you trip as you walk through a door, such as a raised step or threshold. Trim any bushes or trees on the path to your home. Use bright outdoor lighting. Clear any walking paths of anything that might make someone trip, such as rocks or tools. Regularly check to see if handrails are loose or broken. Make sure that both sides of any steps have handrails. Any raised decks and porches should have guardrails on the edges. Have any leaves, snow, or ice cleared regularly. Use sand or salt on walking paths during winter. Clean up any spills in your garage right away. This includes oil or grease spills. What can I do in the bathroom? Use night lights. Install grab bars by the toilet and in the tub and shower. Do not use towel bars as grab bars. Use non-skid mats or decals in the tub or shower. If you need to sit down in  the shower, use a plastic, non-slip stool. Keep the floor dry. Clean up any water that spills on the floor as soon as it happens. Remove soap buildup in the tub or shower regularly. Attach bath mats securely with double-sided non-slip rug tape. Do not have throw rugs and other things on the floor that can make you trip. What can I do in the bedroom? Use night lights. Make sure that you have a light by your bed that is easy to reach. Do not use any sheets or blankets that are too big for your bed. They should not hang down onto the floor. Have a firm chair that has side arms. You can use this for support while you get dressed. Do not have throw rugs and other things on the floor that can make you trip. What can I do in the kitchen? Clean up any spills right away. Avoid walking on wet floors. Keep items that you use a lot in easy-to-reach places. If you need to reach something above you, use a strong step stool that has a grab bar. Keep electrical cords out of the way. Do not use floor polish or wax that makes floors slippery. If you must use wax, use non-skid floor wax. Do not have throw rugs and other things on the floor that can make you trip. What can I do with my stairs? Do not leave any items on the stairs. Make sure  that there are handrails on both sides of the stairs and use them. Fix handrails that are broken or loose. Make sure that handrails are as long as the stairways. Check any carpeting to make sure that it is firmly attached to the stairs. Fix any carpet that is loose or worn. Avoid having throw rugs at the top or bottom of the stairs. If you do have throw rugs, attach them to the floor with carpet tape. Make sure that you have a light switch at the top of the stairs and the bottom of the stairs. If you do not have them, ask someone to add them for you. What else can I do to help prevent falls? Wear shoes that: Do not have high heels. Have rubber bottoms. Are comfortable  and fit you well. Are closed at the toe. Do not wear sandals. If you use a stepladder: Make sure that it is fully opened. Do not climb a closed stepladder. Make sure that both sides of the stepladder are locked into place. Ask someone to hold it for you, if possible. Clearly mark and make sure that you can see: Any grab bars or handrails. First and last steps. Where the edge of each step is. Use tools that help you move around (mobility aids) if they are needed. These include: Canes. Walkers. Scooters. Crutches. Turn on the lights when you go into a dark area. Replace any light bulbs as soon as they burn out. Set up your furniture so you have a clear path. Avoid moving your furniture around. If any of your floors are uneven, fix them. If there are any pets around you, be aware of where they are. Review your medicines with your doctor. Some medicines can make you feel dizzy. This can increase your chance of falling. Ask your doctor what other things that you can do to help prevent falls. This information is not intended to replace advice given to you by your health care provider. Make sure you discuss any questions you have with your health care provider. Document Released: 03/03/2009 Document Revised: 10/13/2015 Document Reviewed: 06/11/2014 Elsevier Interactive Patient Education  2017 [employer].

## 2021-11-03 NOTE — Progress Notes (Cosign Needed)
Subjective:   Ann Bolton is a 76 y.o. female who presents for Medicare Annual (Subsequent) preventive examination.  Virtual Visit via Telephone Note  I connected with  Ann Bolton on 11/03/21 at 11:15 AM EDT by telephone and verified that I am speaking with the correct person using two identifiers.  Location: Patient: Home Provider: [facility] Persons participating in the virtual visit: patient/Nurse Health Advisor   I discussed the limitations, risks, security and privacy concerns of performing an evaluation and management service by telephone and the availability of in person appointments. The patient expressed understanding and agreed to proceed.  Interactive audio and video telecommunications were attempted between this nurse and patient, however failed, due to patient having technical difficulties OR patient did not have access to video capability.  We continued and completed visit with audio only.  Some vital signs may be absent or patient reported.   Jennavieve Arrick E Vernal Hritz, LPN   Review of Systems     Cardiac Risk Factors include: advanced age (>6455men, 64>65 women);dyslipidemia;hypertension     Objective:    Today's Vitals   11/03/21 1119  Weight: 127 lb (57.6 kg)  PainSc: 3    Body mass index is 24 kg/m.     11/03/2021   11:28 AM 11/02/2019    9:43 AM 10/17/2018    2:40 PM  Advanced Directives  Does Patient Have a Medical Advance Directive? Yes Yes Yes  Type of Estate agentAdvance Directive Healthcare Power of [place];Living will Healthcare Power of [place];Living will;Out of facility DNR (pink MOST or yellow form) Healthcare Power of [place];Living will  Does patient want to make changes to medical advance directive?  No - Patient declined No - Patient declined  Copy of Healthcare Power of Attorney in Chart? Yes - validated most recent copy scanned in chart (See row information) Yes - validated most recent copy scanned in chart (See row information) No - copy requested     Current Medications (verified) Outpatient Encounter Medications as of 11/03/2021  Medication Sig   alendronate (FOSAMAX) 70 MG tablet Take 1 tablet (70 mg total) by mouth every 7 (seven) days. Take with a full glass of water on an empty stomach. DISCONTINUE BONIVA   b complex vitamins capsule Take 1 capsule by mouth daily.   Calcium 600-200 MG-UNIT tablet Take 1 tablet by mouth daily.   cholecalciferol (VITAMIN D) 1000 units tablet Take 1,000 Units by mouth daily.   Lido-Menthol-Methyl Sal-Camph (CBD KINGS EX) Apply topically.   lisinopril (ZESTRIL) 20 MG tablet Take 1 tablet (20 mg total) by mouth daily.   multivitamin-iron-minerals-folic acid (CENTRUM) chewable tablet Chew 1 tablet by mouth daily.   pravastatin (PRAVACHOL) 40 MG tablet TAKE 1 TABLET BY MOUTH  DAILY   XIIDRA 5 % SOLN Place 1 drop into both eyes 2 (two) times daily.    [DISCONTINUED] sulfamethoxazole-trimethoprim (BACTRIM DS) 800-160 MG tablet Take 1 tablet by mouth 2 (two) times daily. (Patient not taking: Reported on 11/03/2021)   [DISCONTINUED] vitamin C (ASCORBIC ACID) 500 MG tablet Take 500 mg by mouth daily. (Patient not taking: Reported on 11/03/2021)   No facility-administered encounter medications on file as of 11/03/2021.    Allergies (verified) Patient has no known allergies.   History: Past Medical History:  Diagnosis Date   Allergy    DDD (degenerative disc disease), lumbar    Hyperlipidemia    Hypertension    Osteoporosis    Past Surgical History:  Procedure Laterality Date   CATARACT EXTRACTION, BILATERAL  Family History  Problem Relation Age of Onset   Cancer Mother        Brain and lung    Hypertension Mother    Heart disease Father    Other Brother        died in war    Allergies Daughter    Menstrual problems Daughter    Breast cancer Daughter    Allergies Son    Heart disease Maternal Grandmother    Leukemia Maternal Grandfather    Dementia Paternal Grandmother    Other  Paternal Grandfather        diptheria    Mental illness Brother    Obesity Brother    Allergies Son    Social History   Socioeconomic History   Marital status: Widowed    Spouse name: Not on file   Number of children: 3   Years of education: Not on file   Highest education level: Not on file  Occupational History   Occupation: retired    Comment: banking   Tobacco Use   Smoking status: Never   Smokeless tobacco: Never  Vaping Use   Vaping Use: Never used  Substance and Sexual Activity   Alcohol use: Yes    Comment: occ   Drug use: No   Sexual activity: Not on file  Other Topics Concern   Not on file  Social History Narrative   She lives alone - one level. 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, daughter in [place], another daughter travels for work.   Social Determinants of Health   Financial Resource Strain: Low Risk  (11/03/2021)   Overall Financial Resource Strain (CARDIA)    Difficulty of Paying Living Expenses: Not hard at all  Food Insecurity: No Food Insecurity (11/03/2021)   Hunger Vital Sign    Worried About Running Out of Food in the Last Year: Never true    Ran Out of Food in the Last Year: Never true  Transportation Needs: No Transportation Needs (11/03/2021)   PRAPARE - Administrator, Civil Service (Medical): No    Lack of Transportation (Non-Medical): No  Physical Activity: Sufficiently Active (11/03/2021)   Exercise Vital Sign    Days of Exercise per Week: 7 days    Minutes of Exercise per Session: 30 min  Stress: No Stress Concern Present (11/03/2021)   [employer] of Occupational Health - Occupational Stress Questionnaire    Feeling of Stress : Only a little  Social Connections: Moderately Isolated (11/03/2021)   Social Connection and Isolation Panel [NHANES]    Frequency of Communication with Friends and Family: More than three times a week    Frequency of Social  Gatherings with Friends and Family: More than three times a week    Attends Religious Services: Never    Database administrator or Organizations: Yes    Attends Engineer, structural: More than 4 times per year    Marital Status: Divorced    Tobacco Counseling Counseling given: Not Answered   Clinical Intake:  Pre-visit preparation completed: Yes  Pain : 0-10 Pain Score: 3  Pain Type: Chronic pain Pain Location: Leg Pain Orientation: Left Pain Descriptors / Indicators: Aching, Sore Pain Onset: More than a month ago Pain Frequency: Intermittent     BMI - recorded: 24 Nutritional Status: BMI of 19-24  Normal Nutritional Risks: None Diabetes: No  How often do you need to have someone  help you when you read instructions, pamphlets, or other written materials from your doctor or pharmacy?: 1 - Never  Diabetic? no  Interpreter Needed?: No  Information entered by :: Armenta Erskin, LPN   Activities of Daily Living    11/03/2021   11:20 AM  In your present state of health, do you have any difficulty performing the following activities:  Hearing? 0  Vision? 0  Difficulty concentrating or making decisions? 0  Walking or climbing stairs? 0  Dressing or bathing? 0  Doing errands, shopping? 0  Preparing Food and eating ? N  Using the Toilet? N  In the past six months, have you accidently leaked urine? Y  Comment wears pads for protection  Do you have problems with loss of bowel control? N  Managing your Medications? N  Managing your Finances? N  Housekeeping or managing your Housekeeping? N    Patient Care Team: Raliegh Ip, DO as PCP - General (Family Medicine) Adam Phenix, DPM as Consulting Physician (Podiatry) Conley Rolls, Westley Chandler, MD as Referring Physician (Optometry)  Indicate any recent Medical Services you may have received from other than [facility] providers in the past year (date may be approximate).     Assessment:   This is a routine wellness  examination for [facility].  Hearing/Vision screen Hearing Screening - Comments:: Denies hearing difficulties   Vision Screening - Comments:: Wears rx glasses - up to date with routine eye exams with [facility] in [place]  Dietary issues and exercise activities discussed: Current Exercise Habits: Home exercise routine;Structured exercise class, Type of exercise: walking;treadmill;stretching (lifestyle class 2x per week), Time (Minutes): 30, Frequency (Times/Week): 7, Weekly Exercise (Minutes/Week): 210, Intensity: Moderate, Exercise limited by: orthopedic condition(s)   Goals Addressed             This Visit's Progress    Patient Stated   On track    11/02/2019 AWV Goal: Keep All Scheduled Appointments  Over the next year, patient will attend all scheduled appointments with their PCP and any specialists that they see.      Prevent falls   On track    Stay active        Depression Screen    11/03/2021   11:26 AM 04/10/2021    2:36 PM 02/17/2021    2:39 PM 11/02/2020   11:30 AM 10/24/2020    8:09 AM 03/15/2020    4:05 PM 02/17/2020    8:54 AM  PHQ 2/9 Scores  PHQ - 2 Score 0 0 0 0 0 0 0  PHQ- 9 Score   1 0 2 1     Fall Risk    11/03/2021   11:23 AM 04/10/2021    2:36 PM 11/02/2020   11:41 AM 10/24/2020    8:10 AM 03/15/2020    4:05 PM  Fall Risk   Falls in the past year? 1 0 0 0 0  Number falls in past yr: 0  0 0   Injury with Fall? 0  0 0   Risk for fall due to : Orthopedic patient  Impaired vision;Orthopedic patient    Follow up Falls prevention discussed  Falls prevention discussed Falls evaluation completed     FALL RISK PREVENTION PERTAINING TO THE HOME:  Any stairs in or around the home? No  If so, are there any without handrails? No  Home free of loose throw rugs in walkways, pet beds, electrical cords, etc? Yes  Adequate lighting in your home to reduce  risk of falls? Yes   ASSISTIVE DEVICES UTILIZED TO PREVENT FALLS:  Life alert? No  Use of a cane,  walker or w/c? No  Grab bars in the bathroom? No  Shower chair or bench in shower? No  Elevated toilet seat or a handicapped toilet? No   TIMED UP AND GO:  Was the test performed? No . Telephonic visit  Cognitive Function:        11/03/2021   11:29 AM 11/02/2019    9:49 AM 10/17/2018    2:45 PM  6CIT Screen  What Year? 0 points 0 points 0 points  What month? 0 points 0 points 0 points  What time? 0 points 0 points 0 points  Count back from 20 0 points 0 points 0 points  Months in reverse 0 points 0 points 0 points  Repeat phrase 0 points 0 points 2 points  Total Score 0 points 0 points 2 points    Immunizations Immunization History  Administered Date(s) Administered   Fluad Quad(high Dose 65+) 01/24/2019, 02/17/2020, 02/17/2021   Influenza, High Dose Seasonal PF 03/09/2016, 02/25/2018   Influenza-Unspecified 02/25/2017, 01/27/2019   Moderna Covid-19 Vaccine Bivalent Booster 50yrs & up 05/30/2021   Moderna SARS-COV2 Booster Vaccination 03/16/2020   Moderna Sars-Covid-2 Vaccination 06/11/2019, 07/17/2019, 11/08/2020   Pneumococcal Conjugate-13 03/09/2016   Pneumococcal Polysaccharide-23 03/31/2018   Tdap 02/17/2019   Zoster Recombinat (Shingrix) 01/23/2017, 04/30/2017    TDAP status: Up to date  Flu Vaccine status: Up to date  Pneumococcal vaccine status: Up to date  Covid-19 vaccine status: Completed vaccines  Qualifies for Shingles Vaccine? Yes   Zostavax completed Yes   Shingrix Completed?: Yes  Screening Tests Health Maintenance  Topic Date Due   DEXA SCAN  06/21/2021   COVID-19 Vaccine (5 - Moderna series) 09/27/2021   INFLUENZA VACCINE  12/19/2021   MAMMOGRAM  05/17/2022   TETANUS/TDAP  02/16/2029   Pneumonia Vaccine 18+ Years old  Completed   Hepatitis C Screening  Completed   Zoster Vaccines- Shingrix  Completed   HPV VACCINES  Aged Out    Health Maintenance  Health Maintenance Due  Topic Date Due   DEXA SCAN  06/21/2021   COVID-19 Vaccine  (5 - Moderna series) 09/27/2021    Colorectal cancer screening: Type of screening: Colonoscopy. Completed 03/02/2019. Repeat every 3 years  Mammogram status: Completed 05/17/2021. Repeat every year  Bone Density status: Completed 06/22/2019. Results reflect: Bone density results: OSTEOPOROSIS. Repeat every 2 years.  Lung Cancer Screening: (Low Dose CT Chest recommended if Age 1-80 years, 30 pack-year currently smoking OR have quit w/in 15years.) does not qualify.   Additional Screening:  Hepatitis C Screening: does qualify; Completed 03/09/2016  Vision Screening: Recommended annual ophthalmology exams for early detection of glaucoma and other disorders of the eye. Is the patient up to date with their annual eye exam?  Yes  Who is the provider or what is the name of the office in which the patient attends annual eye exams? [facility] [place] If pt is not established with a provider, would they like to be referred to a provider to establish care? No .   Dental Screening: Recommended annual dental exams for proper oral hygiene  Community Resource Referral / Chronic Care Management: CRR required this visit?  No   CCM required this visit?  No      Plan:     I have personally reviewed and noted the following in the patient's chart:   Medical and social  history Use of alcohol, tobacco or illicit drugs  Current medications and supplements including opioid prescriptions.  Functional ability and status Nutritional status Physical activity Advanced directives List of other physicians Hospitalizations, surgeries, and ER visits in previous 12 months Vitals Screenings to include cognitive, depression, and falls Referrals and appointments  In addition, I have reviewed and discussed with patient certain preventive protocols, quality metrics, and best practice recommendations. A written personalized care plan for preventive services as well as general preventive health  recommendations were provided to patient.     [place] Constable, LPN   05/21/7508   Nurse Notes: Made appt for BP check - she's been taking 20mg  Lisinopril for about 6 weeks now; also still having pain with "shin splint" from over a year ago - is considering Ortho referral - she'll let know.

## 2021-11-07 ENCOUNTER — Other Ambulatory Visit: Payer: Self-pay

## 2021-11-07 ENCOUNTER — Ambulatory Visit (INDEPENDENT_AMBULATORY_CARE_PROVIDER_SITE_OTHER): Payer: Medicare Other

## 2021-11-07 DIAGNOSIS — Z780 Asymptomatic menopausal state: Secondary | ICD-10-CM

## 2021-11-07 DIAGNOSIS — M810 Age-related osteoporosis without current pathological fracture: Secondary | ICD-10-CM

## 2021-11-07 NOTE — Progress Notes (Signed)
Perfect reading 

## 2021-11-07 NOTE — Progress Notes (Signed)
Patient presents to the clinic today for a recheck of BP. Reading today is 135/85.

## 2021-11-08 DIAGNOSIS — M85852 Other specified disorders of bone density and structure, left thigh: Secondary | ICD-10-CM | POA: Diagnosis not present

## 2021-11-08 DIAGNOSIS — M85851 Other specified disorders of bone density and structure, right thigh: Secondary | ICD-10-CM | POA: Diagnosis not present

## 2021-11-08 DIAGNOSIS — Z780 Asymptomatic menopausal state: Secondary | ICD-10-CM | POA: Diagnosis not present

## 2021-11-10 ENCOUNTER — Other Ambulatory Visit: Payer: Self-pay | Admitting: Family Medicine

## 2021-11-10 DIAGNOSIS — M810 Age-related osteoporosis without current pathological fracture: Secondary | ICD-10-CM

## 2021-11-27 ENCOUNTER — Ambulatory Visit: Payer: Medicare Other | Admitting: Orthopedic Surgery

## 2021-11-27 ENCOUNTER — Encounter: Payer: Self-pay | Admitting: Orthopedic Surgery

## 2021-11-27 ENCOUNTER — Ambulatory Visit (INDEPENDENT_AMBULATORY_CARE_PROVIDER_SITE_OTHER): Payer: Medicare Other

## 2021-11-27 DIAGNOSIS — M79662 Pain in left lower leg: Secondary | ICD-10-CM

## 2021-11-27 NOTE — Patient Instructions (Signed)
PT  Rest, ice, medications as needed  Consider an MRI if no improvement with PT

## 2021-11-27 NOTE — Progress Notes (Signed)
New Patient Visit  Assessment: Ann Bolton is a 76 y.o. female with the following: 1. Pain of left lower leg; anterior left tibia  Plan: Ann Bolton has anterior shin pain.  Radiographs negative.   She has tried medicines, topicals, exercises, ice, compression without sustained relief.  She has yet to work with PT.  We will place a referral today.  If pain continues, can consider an MRI.  Follow up in 2 months.    Follow-up: Return in about 8 weeks (around 01/22/2022).  Subjective:  Chief Complaint  Patient presents with   Leg Pain    LT shin area Painful over a year     History of Present Illness: Ann Bolton is a 76 y.o. female who presents for evaluation of left shin pain.  Her pain started over a year ago.  The local [employer] closed and she changed her usual exercise regimen.  She started walking long distance, and the intensity increased.  She started to develop pain in the anterior left shin.  She was evaluated a year ago and XR were negative.  She tried some home exercises, but these did not help.  She continues to have pain.  She takes medications as needed.  She tries not to take medicines more than once a day.  No pain in her knee.   She has used compressive sleeves, which help, but are too hot this time of year.  Her leg feels better when she is exercising.     Review of Systems: No fevers or chills No numbness or tingling No chest pain No shortness of breath No bowel or bladder dysfunction No GI distress No headaches   Medical History:  Past Medical History:  Diagnosis Date   Allergy    DDD (degenerative disc disease), lumbar    Hyperlipidemia    Hypertension    Osteoporosis     Past Surgical History:  Procedure Laterality Date   CATARACT EXTRACTION, BILATERAL      Family History  Problem Relation Age of Onset   Cancer Mother        Brain and lung    Hypertension Mother    Heart disease Father    Other Brother        died in war    Allergies  Daughter    Menstrual problems Daughter    Breast cancer Daughter    Allergies Son    Heart disease Maternal Grandmother    Leukemia Maternal Grandfather    Dementia Paternal Grandmother    Other Paternal Grandfather        diptheria    Mental illness Brother    Obesity Brother    Allergies Son    Social History   Tobacco Use   Smoking status: Never   Smokeless tobacco: Never  Vaping Use   Vaping Use: Never used  Substance Use Topics   Alcohol use: Yes    Comment: occ   Drug use: No    No Known Allergies  Current Meds  Medication Sig   alendronate (FOSAMAX) 70 MG tablet TAKE 1 TABLET BY MOUTH  WEEKLY WITH 8 OZ OF PLAIN  WATER 30 MINUTES BEFORE  FIRST FOOD, DRINK OR MEDS.  STAY UPRIGHT FOR 30 MINS   b complex vitamins capsule Take 1 capsule by mouth daily.   Calcium 600-200 MG-UNIT tablet Take 1 tablet by mouth daily.   cholecalciferol (VITAMIN D) 1000 units tablet Take 1,000 Units by mouth daily.   Lido-Menthol-Methyl Sal-Camph (  CBD KINGS EX) Apply topically.   lisinopril (ZESTRIL) 20 MG tablet Take 1 tablet (20 mg total) by mouth daily.   multivitamin-iron-minerals-folic acid (CENTRUM) chewable tablet Chew 1 tablet by mouth daily.   pravastatin (PRAVACHOL) 40 MG tablet TAKE 1 TABLET BY MOUTH  DAILY   XIIDRA 5 % SOLN Place 1 drop into both eyes 2 (two) times daily.     Objective: There were no vitals taken for this visit.  Physical Exam:  General: Elderly female., Alert and oriented., and No acute distress. Gait: Normal gait.  Left lower leg without swelling.  No bruising.  Tenderness over the anterior tibia.  Minimal discomfort along the medial or lateral joint line.  No effusion.  Full ROM of left knee.    IMAGING: I personally ordered and reviewed the following images  XR of the left tibia obtained in clinic today.  No acute injury.  No fracture.  No lucent lines.  No callus formation.  Knee with minimal degenerative changes.   Impression: Negative left  tibia.    New Medications:  No orders of the defined types were placed in this encounter.     Oliver Barre, MD  11/27/2021 3:28 PM

## 2021-12-04 ENCOUNTER — Other Ambulatory Visit: Payer: Self-pay

## 2021-12-04 ENCOUNTER — Ambulatory Visit: Payer: Medicare Other | Attending: Orthopedic Surgery

## 2021-12-04 DIAGNOSIS — M79662 Pain in left lower leg: Secondary | ICD-10-CM | POA: Insufficient documentation

## 2021-12-04 NOTE — Therapy (Signed)
OUTPATIENT PHYSICAL THERAPY LOWER EXTREMITY EVALUATION   Patient Name: Ann Bolton MRN: [id number] DOB:1945-11-09, 76 y.o., female Today's Date: 12/04/2021   PT End of Session - 12/04/21 0903     Visit Number 1    Number of Visits 8    Date for PT Re-Evaluation 02/16/22    PT Start Time 0904    PT Stop Time 0947    PT Time Calculation (min) 43 min    Activity Tolerance Patient tolerated treatment well    Behavior During Therapy [facility] for tasks assessed/performed             Past Medical History:  Diagnosis Date   Allergy    DDD (degenerative disc disease), lumbar    Hyperlipidemia    Hypertension    Osteoporosis    Past Surgical History:  Procedure Laterality Date   CATARACT EXTRACTION, BILATERAL     Patient Active Problem List   Diagnosis Date Noted   UTI (urinary tract infection) 04/08/2021   Osteoporosis 06/22/2019   Essential hypertension 01/23/2017   Pure hypercholesterolemia 01/23/2017    PCP: Raliegh Ip, DO   REFERRING PROVIDER: Oliver Barre, MD  REFERRING DIAG: Pain of left lower leg  THERAPY DIAG:  Pain in left lower leg  Rationale for Evaluation and Treatment Rehabilitation  ONSET DATE: years ago   SUBJECTIVE:   SUBJECTIVE STATEMENT: Patient reports that her left lower leg has been bothering her for years. She initially thought it was shin splints. She felt it was about 95% better until Dr. Dallas Schimke pressed on her leg last week. She feels that her pain is getting worse. She has needed to cut down her reps at work due to her increased pain with exercise. She had tried ice and heat previously, but this did not seem to help. She has never had anything like this happen before.   PERTINENT HISTORY: HTN, osteoporosis   PAIN:  Are you having pain? Yes: NPRS scale: 6-7/10 Pain location: left shin  Pain description: aching and hurting Aggravating factors: walking long periods (30-45 minutes),   Relieving factors: new orthotics in her  shoes, compression socks  PRECAUTIONS: None  WEIGHT BEARING RESTRICTIONS No  FALLS:  Has patient fallen in last 6 months? 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  No acute injury.  No fracture.  No lucent lines.  No callus formation.  Knee with minimal degenerative changes.   PATIENT SURVEYS:  FOTO to be completed at follow up appointment  COGNITION:  Overall cognitive status: Within functional limits for tasks assessed     SENSATION: Patient reports some tingling in left shin along tibia.  Diminished sensation to light touch along left tibia and anterior tibialis  POSTURE: rounded shoulders and forward head  PALPATION: Left anterior tibialis and tibia (recreate familiar pain)   LOWER EXTREMITY ROM: "tight" along anterior tibialis with active DF; "tight" along gastroc with passive DF  LOWER EXTREMITY MMT:  MMT Right eval Left eval  Hip flexion 4/5 4/5  Hip extension    Hip abduction    Hip adduction    Hip internal rotation    Hip external rotation  Knee flexion 4/5 4+/5  Knee extension 4/5 4+/5  Ankle dorsiflexion 4/5 4/5 with slight discomfort  Ankle plantarflexion    Ankle inversion    Ankle eversion     (Blank rows = not tested)  GAIT: Assistive device utilized: None Level of assistance: Complete Independence Comments: no significant gait deviations noted     TODAY'S TREATMENT: Manual Therapy Soft Tissue Mobilization: left anterior tibialis, reduced familiar pain      PATIENT EDUCATION:  Education details: ice massage, self STM, prognosis Person educated: Patient Education method:  Explanation Education comprehension: verbalized understanding   HOME EXERCISE PROGRAM: Educated on self soft tissue mobilization and ice massage  ASSESSMENT:  CLINICAL IMPRESSION: Patient is a 76 y.o. female who was seen today for physical therapy evaluation and treatment for left anterior tibialis pain. She presented with moderate pain severity and irritability with palpation along the left tibia and anterior tibialis reproducing her familiar pain. However, she exhibited no significant deficits in left ankle active or passive ROM. Soft tissue mobilization to her anterior tibialis was able to reduce her familiar pain. Recommend that she continue with skilled physical therapy to address her remaining impairments to return to her prior level of function.    OBJECTIVE IMPAIRMENTS decreased activity tolerance, decreased mobility, difficulty walking, decreased strength, impaired sensation, impaired tone, and pain.   ACTIVITY LIMITATIONS standing and locomotion level  PARTICIPATION LIMITATIONS: cleaning, shopping, community activity, occupation, and yard work  PERSONAL FACTORS Profession, Time since onset of injury/illness/exacerbation, and 1-2 comorbidities: HTN and osteoporosis  are also affecting patient's functional outcome.   REHAB POTENTIAL: Good  CLINICAL DECISION MAKING: Evolving/moderate complexity  EVALUATION COMPLEXITY: Moderate   GOALS: Goals reviewed with patient? Yes  LONG TERM GOALS: Target date: 01/02/2022   Patient will be independent with her HEP.  Baseline:  Goal status: INITIAL  2.  Patient will be able to complete her daily activities without her familiar pain exceeding 3/10. Baseline:  Goal status: INITIAL  3.  Patient will be able to stand and walk for at least 45 minutes without being limited by her familiar left lower leg pain.  Baseline:  Goal status: INITIAL  4.  Patient will be able to return to her familiar gym activities without being limited by her  familiar pain.  Baseline:  Goal status: INITIAL   PLAN: PT FREQUENCY: 2x/week  PT DURATION: 4 weeks  PLANNED INTERVENTIONS: Therapeutic exercises, Therapeutic activity, Neuromuscular re-education, Balance training, Patient/Family education, Dry Needling, Electrical stimulation, Cryotherapy, Moist heat, Taping, Vasopneumatic device, Manual therapy, and Re-evaluation  PLAN FOR NEXT SESSION: recumbent bike, heel/toe raises, STM to anterior tibialis, and modalities as needed   Granville Lewis, PT 12/04/2021, 3:15 PM

## 2021-12-08 ENCOUNTER — Ambulatory Visit: Payer: Medicare Other

## 2021-12-08 DIAGNOSIS — M79662 Pain in left lower leg: Secondary | ICD-10-CM

## 2021-12-08 NOTE — Therapy (Signed)
OUTPATIENT PHYSICAL THERAPY LOWER EXTREMITY TREATMENT   Patient Name: Ann Bolton MRN: [id number] DOB:[birth year], 76 y.o., female Today's Date: 12/08/2021   PT End of Session - 12/08/21 0946     Visit Number 2    Number of Visits 8    Date for PT Re-Evaluation 02/16/22    PT Start Time 0945    PT Stop Time 1030    PT Time Calculation (min) 45 min    Activity Tolerance Patient tolerated treatment well    Behavior During Therapy [facility] for tasks assessed/performed              Past Medical History:  Diagnosis Date   Allergy    DDD (degenerative disc disease), lumbar    Hyperlipidemia    Hypertension    Osteoporosis    Past Surgical History:  Procedure Laterality Date   CATARACT EXTRACTION, BILATERAL     Patient Active Problem List   Diagnosis Date Noted   UTI (urinary tract infection) 04/08/2021   Osteoporosis 06/22/2019   Essential hypertension 01/23/2017   Pure hypercholesterolemia 01/23/2017    PCP: Raliegh Ip, DO   REFERRING PROVIDER: Oliver Barre, MD  REFERRING DIAG: Pain of left lower leg  THERAPY DIAG:  Pain in left lower leg  Rationale for Evaluation and Treatment Rehabilitation  ONSET DATE: years ago   SUBJECTIVE:   SUBJECTIVE STATEMENT: Patient reports that her leg felt better after her evaluation. However, it started bothering her a little more after she exercised yesterday, but not as bad as it was previously.   PERTINENT HISTORY: HTN, osteoporosis   PAIN:  Are you having pain? Yes: NPRS scale: <5/10 Pain location: left shin  Pain description: aching and hurting Aggravating factors: walking long periods (30-45 minutes),   Relieving factors: new orthotics in her shoes, compression socks  PRECAUTIONS: None  WEIGHT BEARING RESTRICTIONS No  FALLS:  Has patient fallen in last 6 months? 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  No acute injury.  No fracture.  No lucent lines.  No callus formation.  Knee with minimal degenerative changes.   PATIENT SURVEYS:  FOTO to be completed at follow up appointment  COGNITION:  Overall cognitive status: Within functional limits for tasks assessed     SENSATION: Patient reports some tingling in left shin along tibia.  Diminished sensation to light touch along left tibia and anterior tibialis  POSTURE: rounded shoulders and forward head  PALPATION: Left anterior tibialis and tibia (recreate familiar pain)   LOWER EXTREMITY ROM: "tight" along anterior tibialis with active DF; "tight" along gastroc with passive DF  LOWER EXTREMITY MMT:  MMT Right eval Left eval  Hip flexion 4/5 4/5  Hip extension    Hip abduction    Hip adduction    Hip internal rotation    Hip external rotation    Knee flexion 4/5 4+/5  Knee extension 4/5 4+/5  Ankle dorsiflexion 4/5 4/5 with slight discomfort  Ankle plantarflexion    Ankle inversion    Ankle eversion     (Blank rows = not tested)  GAIT: Assistive device utilized: None Level  of assistance: Complete Independence Comments: no significant gait deviations noted     TODAY'S TREATMENT:                                   7/21 EXERCISE LOG  Exercise Repetitions and Resistance Comments  Recumbent bike L8 x 11 minutes    Rocker board 5 minutes   Tandem balance on foam 4 x 30 seconds             Blank cell = exercise not performed today  Manual Therapy Soft Tissue Mobilization: to left anterior tibialis, for pain relief   PATIENT EDUCATION:  Education details: expectations for muscle soreness  Person educated: Patient Education  method: Explanation Education comprehension: verbalized understanding   HOME EXERCISE PROGRAM: Educated on self soft tissue mobilization and ice massage  ASSESSMENT:  CLINICAL IMPRESSION: Patient was introduced to multiple new interventions for improved lower extremity stability and ankle soft tissue extensibility. She required minimal cueing with the rocker board for slow controlled mobility. Manual therapy focused on soft tissue mobilization to the left anterior tibialis as this was moderately effective at reducing her familiar symptoms. She reported that her leg felt better upon the conclusion of treatment. She continues to require skilled physical therapy to address her remaining impairments to return to her prior level of function.    OBJECTIVE IMPAIRMENTS decreased activity tolerance, decreased mobility, difficulty walking, decreased strength, impaired sensation, impaired tone, and pain.   ACTIVITY LIMITATIONS standing and locomotion level  PARTICIPATION LIMITATIONS: cleaning, shopping, community activity, occupation, and yard work  PERSONAL FACTORS Profession, Time since onset of injury/illness/exacerbation, and 1-2 comorbidities: HTN and osteoporosis  are also affecting patient's functional outcome.   REHAB POTENTIAL: Good  CLINICAL DECISION MAKING: Evolving/moderate complexity  EVALUATION COMPLEXITY: Moderate   GOALS: Goals reviewed with patient? Yes  LONG TERM GOALS: Target date: 01/02/2022   Patient will be independent with her HEP.  Baseline:  Goal status: INITIAL  2.  Patient will be able to complete her daily activities without her familiar pain exceeding 3/10. Baseline:  Goal status: INITIAL  3.  Patient will be able to stand and walk for at least 45 minutes without being limited by her familiar left lower leg pain.  Baseline:  Goal status: INITIAL  4.  Patient will be able to return to her familiar gym activities without being limited by her familiar pain.   Baseline:  Goal status: INITIAL   PLAN: PT FREQUENCY: 2x/week  PT DURATION: 4 weeks  PLANNED INTERVENTIONS: Therapeutic exercises, Therapeutic activity, Neuromuscular re-education, Balance training, Patient/Family education, Dry Needling, Electrical stimulation, Cryotherapy, Moist heat, Taping, Vasopneumatic device, Manual therapy, and Re-evaluation  PLAN FOR NEXT SESSION: recumbent bike, heel/toe raises, STM to anterior tibialis, and modalities as needed   Granville Lewis, PT 12/08/2021, 12:23 PM

## 2021-12-11 ENCOUNTER — Encounter: Payer: Self-pay | Admitting: Physical Therapy

## 2021-12-11 ENCOUNTER — Ambulatory Visit: Payer: Medicare Other | Admitting: Physical Therapy

## 2021-12-11 DIAGNOSIS — M79662 Pain in left lower leg: Secondary | ICD-10-CM | POA: Diagnosis not present

## 2021-12-11 NOTE — Therapy (Signed)
OUTPATIENT PHYSICAL THERAPY LOWER EXTREMITY TREATMENT   Patient Name: Ann Bolton MRN: [id number] DOB:[birth year], 76 y.o., female Today's Date: 12/11/2021   PT End of Session - 12/11/21 1350     Visit Number 3    Number of Visits 8    Date for PT Re-Evaluation 02/16/22    PT Start Time 1350    PT Stop Time 1427    PT Time Calculation (min) 37 min    Activity Tolerance Patient tolerated treatment well    Behavior During Therapy [facility] for tasks assessed/performed              Past Medical History:  Diagnosis Date   Allergy    DDD (degenerative disc disease), lumbar    Hyperlipidemia    Hypertension    Osteoporosis    Past Surgical History:  Procedure Laterality Date   CATARACT EXTRACTION, BILATERAL     Patient Active Problem List   Diagnosis Date Noted   UTI (urinary tract infection) 04/08/2021   Osteoporosis 06/22/2019   Essential hypertension 01/23/2017   Pure hypercholesterolemia 01/23/2017    PCP: Raliegh Ip, DO   REFERRING PROVIDER: Oliver Barre, MD  REFERRING DIAG: Pain of left lower leg  THERAPY DIAG:  Pain in left lower leg  Rationale for Evaluation and Treatment Rehabilitation  ONSET DATE: years ago   SUBJECTIVE:   SUBJECTIVE STATEMENT: Reports that she has had some pain in the L anterior tib. May have gone too forceful in last PT session.  PERTINENT HISTORY: HTN, osteoporosis   PAIN:  Are you having pain? Yes: NPRS scale: 3/10 Pain location: left shin  Pain description: aching and hurting Aggravating factors: walking long periods (30-45 minutes),   Relieving factors: new orthotics in her shoes, compression socks  PRECAUTIONS: None  WEIGHT BEARING RESTRICTIONS No   OBJECTIVE: TODAY'S TREATMENT:                                   7/21 EXERCISE LOG  Exercise Repetitions and Resistance Comments  Recumbent bike L5 x 10 minutes    Rocker board 5 minutes                Blank cell = exercise not performed today   Manual Therapy Soft Tissue Mobilization: to left anterior tibialis, for pain relief and release TP   PATIENT EDUCATION:  Education details: expectations for muscle soreness  Person educated: Patient Education method: Explanation Education comprehension: verbalized understanding   HOME EXERCISE PROGRAM: Educated on self soft tissue mobilization and ice massage  ASSESSMENT:  CLINICAL IMPRESSION: Patient presented in clinic with minimal pain in L anterior tibialis. Patient had less resistance in stationary bike and more stretching. Patient had palpable TPs and tightness of the L anterior tibialis. Patient felt relief with manual therapy. Had no complaints following end of session.   OBJECTIVE IMPAIRMENTS decreased activity tolerance, decreased mobility, difficulty walking, decreased strength, impaired sensation, impaired tone, and pain.   ACTIVITY LIMITATIONS standing and locomotion level  PARTICIPATION LIMITATIONS: cleaning, shopping, community activity, occupation, and yard work  PERSONAL FACTORS Profession, Time since onset of injury/illness/exacerbation, and 1-2 comorbidities: HTN and osteoporosis  are also affecting patient's functional outcome.   REHAB POTENTIAL: Good  CLINICAL DECISION MAKING: Evolving/moderate complexity  EVALUATION COMPLEXITY: Moderate   GOALS: Goals reviewed with patient? Yes  LONG TERM GOALS: Target date: 01/02/2022   Patient will be independent with her HEP.  Baseline:  Goal status: INITIAL  2.  Patient will be able to complete her daily activities without her familiar pain exceeding 3/10. Baseline:  Goal status: INITIAL  3.  Patient will be able to stand and walk for at least 45 minutes without being limited by her familiar left lower leg pain.  Baseline:  Goal status: INITIAL  4.  Patient will be able to return to her familiar gym activities without being limited by her familiar pain.  Baseline:  Goal status: INITIAL   PLAN: PT  FREQUENCY: 2x/week  PT DURATION: 4 weeks  PLANNED INTERVENTIONS: Therapeutic exercises, Therapeutic activity, Neuromuscular re-education, Balance training, Patient/Family education, Dry Needling, Electrical stimulation, Cryotherapy, Moist heat, Taping, Vasopneumatic device, Manual therapy, and Re-evaluation  PLAN FOR NEXT SESSION: recumbent bike, heel/toe raises, STM to anterior tibialis, and modalities as needed   Marvell Fuller, PTA 12/11/2021, 3:20 PM

## 2021-12-18 ENCOUNTER — Ambulatory Visit: Payer: Medicare Other | Admitting: Physical Therapy

## 2021-12-18 ENCOUNTER — Encounter: Payer: Self-pay | Admitting: Physical Therapy

## 2021-12-18 DIAGNOSIS — M79662 Pain in left lower leg: Secondary | ICD-10-CM | POA: Diagnosis not present

## 2021-12-18 NOTE — Therapy (Signed)
OUTPATIENT PHYSICAL THERAPY LOWER EXTREMITY TREATMENT   Patient Name: Ann Bolton MRN: [id number] DOB:November 13, 1945, 76 y.o., female Today's Date: 12/18/2021   PT End of Session - 12/18/21 1349     Visit Number 4    Number of Visits 8    Date for PT Re-Evaluation 02/16/22    PT Start Time 1349    PT Stop Time 1428    PT Time Calculation (min) 39 min    Activity Tolerance Patient tolerated treatment well    Behavior During Therapy [facility] for tasks assessed/performed              Past Medical History:  Diagnosis Date   Allergy    DDD (degenerative disc disease), lumbar    Hyperlipidemia    Hypertension    Osteoporosis    Past Surgical History:  Procedure Laterality Date   CATARACT EXTRACTION, BILATERAL     Patient Active Problem List   Diagnosis Date Noted   UTI (urinary tract infection) 04/08/2021   Osteoporosis 06/22/2019   Essential hypertension 01/23/2017   Pure hypercholesterolemia 01/23/2017    PCP: Raliegh Ip, DO   REFERRING PROVIDER: Oliver Barre, MD  REFERRING DIAG: Pain of left lower leg  THERAPY DIAG:  Pain in left lower leg  Rationale for Evaluation and Treatment Rehabilitation  ONSET DATE: years ago   SUBJECTIVE:   SUBJECTIVE STATEMENT: Reports that she felt good until yesterday. States that she had spontaneous tib pain return. Did some walking yesterday afternoon to downtown.  PERTINENT HISTORY: HTN, osteoporosis   PAIN:  Are you having pain? Yes: NPRS scale: "aware of it"/10 Pain location: left shin  Pain description: aching and hurting Aggravating factors: walking long periods (30-45 minutes),   Relieving factors: new orthotics in her shoes, compression socks  PRECAUTIONS: None  WEIGHT BEARING RESTRICTIONS No   OBJECTIVE: TODAY'S TREATMENT:                                   7/31 EXERCISE LOG  Exercise Repetitions and Resistance Comments  Recumbent bike L5 x 12 minutes    Rocker board 5 minutes   Heel raises   X20 reps   Eccentric heel raise X20 reps At wall       Blank cell = exercise not performed today  Manual Therapy Soft Tissue Mobilization: IASTW/STWto left anterior tibialis, for pain relief and release TP   PATIENT EDUCATION:  Education details: expectations for muscle soreness  Person educated: Patient Education method: Explanation Education comprehension: verbalized understanding   HOME EXERCISE PROGRAM: Educated on self soft tissue mobilization and ice massage  ASSESSMENT:  CLINICAL IMPRESSION: Patient progressed to more eccentric anterior tibialis strengthening today as well as continued stretching. No complaints of pain during therex. Increased tone palpable in L anterior tibialis and IASTW and STW to reduce tightness and corresponding pain. Patient reported more tenderness in distal portion of tibia.    OBJECTIVE IMPAIRMENTS decreased activity tolerance, decreased mobility, difficulty walking, decreased strength, impaired sensation, impaired tone, and pain.   ACTIVITY LIMITATIONS standing and locomotion level  PARTICIPATION LIMITATIONS: cleaning, shopping, community activity, occupation, and yard work  PERSONAL FACTORS Profession, Time since onset of injury/illness/exacerbation, and 1-2 comorbidities: HTN and osteoporosis  are also affecting patient's functional outcome.   REHAB POTENTIAL: Good  CLINICAL DECISION MAKING: Evolving/moderate complexity  EVALUATION COMPLEXITY: Moderate   GOALS: Goals reviewed with patient? Yes  LONG TERM GOALS: Target date:  01/02/2022   Patient will be independent with her HEP.  Baseline:  Goal status: INITIAL  2.  Patient will be able to complete her daily activities without her familiar pain exceeding 3/10. Baseline:  Goal status: INITIAL  3.  Patient will be able to stand and walk for at least 45 minutes without being limited by her familiar left lower leg pain.  Baseline:  Goal status: INITIAL  4.  Patient will be able to  return to her familiar gym activities without being limited by her familiar pain.  Baseline:  Goal status: INITIAL   PLAN: PT FREQUENCY: 2x/week  PT DURATION: 4 weeks  PLANNED INTERVENTIONS: Therapeutic exercises, Therapeutic activity, Neuromuscular re-education, Balance training, Patient/Family education, Dry Needling, Electrical stimulation, Cryotherapy, Moist heat, Taping, Vasopneumatic device, Manual therapy, and Re-evaluation  PLAN FOR NEXT SESSION: recumbent bike, heel/toe raises, STM to anterior tibialis, and modalities as needed   Marvell Fuller, PTA 12/18/2021, 2:44 PM

## 2021-12-22 ENCOUNTER — Encounter: Payer: Self-pay | Admitting: Physical Therapy

## 2021-12-22 ENCOUNTER — Ambulatory Visit: Payer: Medicare Other | Attending: Orthopedic Surgery | Admitting: Physical Therapy

## 2021-12-22 DIAGNOSIS — M79662 Pain in left lower leg: Secondary | ICD-10-CM | POA: Diagnosis not present

## 2021-12-22 NOTE — Therapy (Signed)
OUTPATIENT PHYSICAL THERAPY LOWER EXTREMITY TREATMENT   Patient Name: Ann Bolton MRN: [id number] DOB:06/15/1945, 76 y.o., female Today's Date: 12/22/2021   PT End of Session - 12/22/21 1007     Visit Number 5    Number of Visits 8    Date for PT Re-Evaluation 02/16/22    PT Start Time 0949    PT Stop Time 1028    PT Time Calculation (min) 39 min    Activity Tolerance Patient tolerated treatment well    Behavior During Therapy [facility] for tasks assessed/performed              Past Medical History:  Diagnosis Date   Allergy    DDD (degenerative disc disease), lumbar    Hyperlipidemia    Hypertension    Osteoporosis    Past Surgical History:  Procedure Laterality Date   CATARACT EXTRACTION, BILATERAL     Patient Active Problem List   Diagnosis Date Noted   UTI (urinary tract infection) 04/08/2021   Osteoporosis 06/22/2019   Essential hypertension 01/23/2017   Pure hypercholesterolemia 01/23/2017    PCP: Raliegh Ip, DO   REFERRING PROVIDER: Oliver Barre, MD  REFERRING DIAG: Pain of left lower leg  THERAPY DIAG:  Pain in left lower leg  Rationale for Evaluation and Treatment Rehabilitation  ONSET DATE: years ago   SUBJECTIVE:   SUBJECTIVE STATEMENT: No pain today or recently with yard work.  PERTINENT HISTORY: HTN, osteoporosis   PAIN:  No pain at arrival  PRECAUTIONS: None  WEIGHT BEARING RESTRICTIONS No   OBJECTIVE: TODAY'S TREATMENT:                                   7/31 EXERCISE LOG  Exercise Repetitions and Resistance Comments  Recumbent bike L5 x 16 minutes    Rocker board 5 minutes   Heel raises  X20 reps   Eccentric heel raise X20 reps At wall       Blank cell = exercise not performed today  Manual Therapy Soft Tissue Mobilization: IASTW/STWto left anterior tibialis, for pain relief and release TP   PATIENT EDUCATION:  Education details: expectations for muscle soreness  Person educated: Patient Education method:  Explanation Education comprehension: verbalized understanding   HOME EXERCISE PROGRAM: Educated on self soft tissue mobilization and ice massage  ASSESSMENT:  CLINICAL IMPRESSION: Patient continues to tolerate all therex and stretching well and able to complete housework and yard work. Patient had bruising along anterior tibialis from a table hitting her leg yesterday. Patient especially tender along anterior tibialis today but may be from bruising as well. Less tone palpable in anterior tibialis.   OBJECTIVE IMPAIRMENTS decreased activity tolerance, decreased mobility, difficulty walking, decreased strength, impaired sensation, impaired tone, and pain.   ACTIVITY LIMITATIONS standing and locomotion level  PARTICIPATION LIMITATIONS: cleaning, shopping, community activity, occupation, and yard work  PERSONAL FACTORS Profession, Time since onset of injury/illness/exacerbation, and 1-2 comorbidities: HTN and osteoporosis  are also affecting patient's functional outcome.   REHAB POTENTIAL: Good  CLINICAL DECISION MAKING: Evolving/moderate complexity  EVALUATION COMPLEXITY: Moderate   GOALS: Goals reviewed with patient? Yes  LONG TERM GOALS: Target date: 01/02/2022   Patient will be independent with her HEP.  Baseline:  Goal status: INITIAL  2.  Patient will be able to complete her daily activities without her familiar pain exceeding 3/10. Baseline:  Goal status: INITIAL  3.  Patient will be  able to stand and walk for at least 45 minutes without being limited by her familiar left lower leg pain.  Baseline:  Goal status: INITIAL  4.  Patient will be able to return to her familiar gym activities without being limited by her familiar pain.  Baseline:  Goal status: INITIAL   PLAN: PT FREQUENCY: 2x/week  PT DURATION: 4 weeks  PLANNED INTERVENTIONS: Therapeutic exercises, Therapeutic activity, Neuromuscular re-education, Balance training, Patient/Family education, Dry  Needling, Electrical stimulation, Cryotherapy, Moist heat, Taping, Vasopneumatic device, Manual therapy, and Re-evaluation  PLAN FOR NEXT SESSION: recumbent bike, heel/toe raises, STM to anterior tibialis, and modalities as needed   Marvell Fuller, PTA 12/22/2021, 11:30 AM

## 2021-12-25 ENCOUNTER — Encounter: Payer: Self-pay | Admitting: Physical Therapy

## 2021-12-25 ENCOUNTER — Ambulatory Visit: Payer: Medicare Other | Admitting: Physical Therapy

## 2021-12-25 DIAGNOSIS — M79662 Pain in left lower leg: Secondary | ICD-10-CM

## 2021-12-25 NOTE — Therapy (Signed)
OUTPATIENT PHYSICAL THERAPY LOWER EXTREMITY TREATMENT   Patient Name: Ann Bolton MRN: [id number] DOB:[birth year], 76 y.o., female Today's Date: 12/25/2021   PT End of Session - 12/25/21 1349     Visit Number 6    Number of Visits 8    Date for PT Re-Evaluation 02/16/22    PT Start Time 1346    PT Stop Time 1426    PT Time Calculation (min) 40 min    Activity Tolerance Patient tolerated treatment well    Behavior During Therapy [facility] for tasks assessed/performed              Past Medical History:  Diagnosis Date   Allergy    DDD (degenerative disc disease), lumbar    Hyperlipidemia    Hypertension    Osteoporosis    Past Surgical History:  Procedure Laterality Date   CATARACT EXTRACTION, BILATERAL     Patient Active Problem List   Diagnosis Date Noted   UTI (urinary tract infection) 04/08/2021   Osteoporosis 06/22/2019   Essential hypertension 01/23/2017   Pure hypercholesterolemia 01/23/2017    PCP: Raliegh Ip, DO   REFERRING PROVIDER: Oliver Barre, MD  REFERRING DIAG: Pain of left lower leg  THERAPY DIAG:  Pain in left lower leg  Rationale for Evaluation and Treatment Rehabilitation  ONSET DATE: years ago   SUBJECTIVE:   SUBJECTIVE STATEMENT: No pain until this morning when she began having pain.  PERTINENT HISTORY: HTN, osteoporosis   PAIN:  Are you having pain? Yes: NPRS scale: 3/10 Pain location: L lower leg Pain description: discomfort Aggravating factors: walking Relieving factors: rest    PRECAUTIONS: None  WEIGHT BEARING RESTRICTIONS No   OBJECTIVE: TODAY'S TREATMENT:                                   12/25/2021 EXERCISE LOG  Exercise Repetitions and Resistance Comments  Recumbent bike L5 x 14 minutes    Standing calf stretch LLE 3x30 sec   Heel raises  X20 reps   Eccentric toe raise X20 reps At wall  LLE SLS 2x1 min   Resisted ankle inversion Red x20 reps   Resisted ankle eversion Red x20 reps   Anterior tib  stretch Seated 2x1 min    Blank cell = exercise not performed today  Manual Therapy Soft Tissue Mobilization: IASTW/STWto left anterior tibialis, for pain relief and release TP   PATIENT EDUCATION:  Education details: expectations for muscle soreness  Person educated: Patient Education method: Explanation Education comprehension: verbalized understanding   HOME EXERCISE PROGRAM: Educated on self soft tissue mobilization and ice massage  ASSESSMENT:  CLINICAL IMPRESSION: Patient presented in clinic with mild reports of pain in anterior tibialis. Patient progressed to more stretching and ankle strengthening without complaint of pain. Patient had ankle instability intermittantly with SLS. Good redness response to IASTW in LLE.   OBJECTIVE IMPAIRMENTS decreased activity tolerance, decreased mobility, difficulty walking, decreased strength, impaired sensation, impaired tone, and pain.   ACTIVITY LIMITATIONS standing and locomotion level  PARTICIPATION LIMITATIONS: cleaning, shopping, community activity, occupation, and yard work  PERSONAL FACTORS Profession, Time since onset of injury/illness/exacerbation, and 1-2 comorbidities: HTN and osteoporosis  are also affecting patient's functional outcome.   REHAB POTENTIAL: Good  CLINICAL DECISION MAKING: Evolving/moderate complexity  EVALUATION COMPLEXITY: Moderate   GOALS: Goals reviewed with patient? Yes  LONG TERM GOALS: Target date: 01/02/2022   Patient will be independent  with her HEP.  Baseline:  Goal status: INITIAL  2.  Patient will be able to complete her daily activities without her familiar pain exceeding 3/10. Baseline:  Goal status: INITIAL  3.  Patient will be able to stand and walk for at least 45 minutes without being limited by her familiar left lower leg pain.  Baseline:  Goal status: INITIAL  4.  Patient will be able to return to her familiar gym activities without being limited by her familiar pain.   Baseline:  Goal status: INITIAL   PLAN: PT FREQUENCY: 2x/week  PT DURATION: 4 weeks  PLANNED INTERVENTIONS: Therapeutic exercises, Therapeutic activity, Neuromuscular re-education, Balance training, Patient/Family education, Dry Needling, Electrical stimulation, Cryotherapy, Moist heat, Taping, Vasopneumatic device, Manual therapy, and Re-evaluation  PLAN FOR NEXT SESSION: recumbent bike, heel/toe raises, STM to anterior tibialis, and modalities as needed   Marvell Fuller, PTA 12/25/2021, 2:39 PM

## 2021-12-29 ENCOUNTER — Ambulatory Visit: Payer: Medicare Other | Admitting: Physical Therapy

## 2021-12-29 ENCOUNTER — Encounter: Payer: Self-pay | Admitting: Physical Therapy

## 2021-12-29 DIAGNOSIS — M79662 Pain in left lower leg: Secondary | ICD-10-CM | POA: Diagnosis not present

## 2021-12-29 NOTE — Therapy (Signed)
OUTPATIENT PHYSICAL THERAPY LOWER EXTREMITY TREATMENT   Patient Name: Ann Bolton MRN: [id number] DOB:1945/07/26, 76 y.o., female Today's Date: 12/29/2021   PT End of Session - 12/29/21 0948     Visit Number 7    Number of Visits 8    Date for PT Re-Evaluation 02/16/22    PT Start Time 0946    PT Stop Time 1028    PT Time Calculation (min) 42 min    Activity Tolerance Patient tolerated treatment well    Behavior During Therapy [facility] for tasks assessed/performed              Past Medical History:  Diagnosis Date   Allergy    DDD (degenerative disc disease), lumbar    Hyperlipidemia    Hypertension    Osteoporosis    Past Surgical History:  Procedure Laterality Date   CATARACT EXTRACTION, BILATERAL     Patient Active Problem List   Diagnosis Date Noted   UTI (urinary tract infection) 04/08/2021   Osteoporosis 06/22/2019   Essential hypertension 01/23/2017   Pure hypercholesterolemia 01/23/2017    PCP: Raliegh Ip, DO   REFERRING PROVIDER: Oliver Barre, MD  REFERRING DIAG: Pain of left lower leg  THERAPY DIAG:  Pain in left lower leg  Rationale for Evaluation and Treatment Rehabilitation  ONSET DATE: years ago   SUBJECTIVE:   SUBJECTIVE STATEMENT: No pain.  PERTINENT HISTORY: HTN, osteoporosis   PAIN:  Are you having pain? No  PRECAUTIONS: None  WEIGHT BEARING RESTRICTIONS No   OBJECTIVE: TODAY'S TREATMENT:                                   12/25/2021 EXERCISE LOG  Exercise Repetitions and Resistance Comments  Recumbent bike L5 x 15 minutes    DLS on BOSU X2 min   Heel raises  X20 reps   Toe raise X20 reps   LLE SLS 2x1 min   Resisted ankle inversion Red x20 reps   Resisted ankle eversion Red x20 reps   Anterior tib stretch Seated 2x1 min    Blank cell = exercise not performed today   PATIENT EDUCATION:  Education details: expectations for muscle soreness  Person educated: Patient Education method:  Explanation Education comprehension: verbalized understanding   HOME EXERCISE PROGRAM: Educated on self soft tissue mobilization and ice massage  ASSESSMENT:  CLINICAL IMPRESSION: Patient presented in clinic with no complaints of shin pain today. Patient had not done any exercise prior to PT this morning. Patient able to tolerate all therex for strengthening and proprioception. No TPs or muscle tightness palpated in L anterior tibialis.   OBJECTIVE IMPAIRMENTS decreased activity tolerance, decreased mobility, difficulty walking, decreased strength, impaired sensation, impaired tone, and pain.   ACTIVITY LIMITATIONS standing and locomotion level  PARTICIPATION LIMITATIONS: cleaning, shopping, community activity, occupation, and yard work  PERSONAL FACTORS Profession, Time since onset of injury/illness/exacerbation, and 1-2 comorbidities: HTN and osteoporosis  are also affecting patient's functional outcome.   REHAB POTENTIAL: Good  CLINICAL DECISION MAKING: Evolving/moderate complexity  EVALUATION COMPLEXITY: Moderate   GOALS: Goals reviewed with patient? Yes  LONG TERM GOALS: Target date: 01/02/2022   Patient will be independent with her HEP.  Baseline:  Goal status: On-going  2.  Patient will be able to complete her daily activities without her familiar pain exceeding 3/10. Baseline:  Goal status: On-going  3.  Patient will be able to stand and  walk for at least 45 minutes without being limited by her familiar left lower leg pain.  Baseline:  Goal status: On-going  4.  Patient will be able to return to her familiar gym activities without being limited by her familiar pain.  Baseline:  Goal status: On-going   PLAN: PT FREQUENCY: 2x/week  PT DURATION: 4 weeks  PLANNED INTERVENTIONS: Therapeutic exercises, Therapeutic activity, Neuromuscular re-education, Balance training, Patient/Family education, Dry Needling, Electrical stimulation, Cryotherapy, Moist heat,  Taping, Vasopneumatic device, Manual therapy, and Re-evaluation  PLAN FOR NEXT SESSION: recumbent bike, heel/toe raises, STM to anterior tibialis, and modalities as needed   Marvell Fuller, PTA 12/29/2021, 10:59 AM

## 2022-01-01 ENCOUNTER — Ambulatory Visit: Payer: Medicare Other

## 2022-01-01 DIAGNOSIS — M79662 Pain in left lower leg: Secondary | ICD-10-CM

## 2022-01-01 NOTE — Therapy (Addendum)
OUTPATIENT PHYSICAL THERAPY LOWER EXTREMITY TREATMENT   Patient Name: Ann Bolton MRN: [id number] DOB:[birth year], 76 y.o., female Today's Date: 01/01/2022   PT End of Session - 01/01/22 0948     Visit Number 8    Number of Visits 8    Date for PT Re-Evaluation 02/16/22    PT Start Time 0945    PT Stop Time 1030    PT Time Calculation (min) 45 min    Activity Tolerance Patient tolerated treatment well    Behavior During Therapy [facility] for tasks assessed/performed              Past Medical History:  Diagnosis Date   Allergy    DDD (degenerative disc disease), lumbar    Hyperlipidemia    Hypertension    Osteoporosis    Past Surgical History:  Procedure Laterality Date   CATARACT EXTRACTION, BILATERAL     Patient Active Problem List   Diagnosis Date Noted   UTI (urinary tract infection) 04/08/2021   Osteoporosis 06/22/2019   Essential hypertension 01/23/2017   Pure hypercholesterolemia 01/23/2017    PCP: Janora Norlander, DO   REFERRING PROVIDER: Mordecai Rasmussen, MD  REFERRING DIAG: Pain of left lower leg  THERAPY DIAG:  Pain in left lower leg  Rationale for Evaluation and Treatment Rehabilitation  ONSET DATE: years ago   SUBJECTIVE:   SUBJECTIVE STATEMENT:   Pt reports mild shin soreness today, but reports that she did a lot of walking this weekend.  PERTINENT HISTORY: HTN, osteoporosis   PAIN:  Are you having pain? No  PRECAUTIONS: None  WEIGHT BEARING RESTRICTIONS No   OBJECTIVE: TODAY'S TREATMENT:                                   01/01/2022 EXERCISE LOG  Exercise Repetitions and Resistance Comments  Recumbent bike L5 x 20 minutes    Lunges 14" 20 reps x 3 sec hold   Rockerboard 4 mins   DLS on BOSU X2 min   Heel raises  X20 reps   Toe raise X20 reps   LLE SLS 2x1 min   Resisted ankle inversion Red x20 reps   Resisted ankle eversion Red x20 reps   Anterior tib stretch Seated 2x1 min    Blank cell = exercise not performed today    PATIENT EDUCATION:  Education details: expectations for muscle soreness  Person educated: Patient Education method: Explanation Education comprehension: verbalized understanding   HOME EXERCISE PROGRAM: Educated on self soft tissue mobilization and ice massage  ASSESSMENT:  CLINICAL IMPRESSION: Pt arrives for today's treatment session reporting mild left shin soreness, but not enough to quantify on pain scale.  Pt has met all of the goals set forth for her by physical therapy at this time.  Pt has returned to normal activities throughout the day and has returned to the gym attending classes regularly.  All  questions encouraged and answered.  Pt instructed to contact the facility with any issues, concerns, or questions.    OBJECTIVE IMPAIRMENTS decreased activity tolerance, decreased mobility, difficulty walking, decreased strength, impaired sensation, impaired tone, and pain.   ACTIVITY LIMITATIONS standing and locomotion level  PARTICIPATION LIMITATIONS: cleaning, shopping, community activity, occupation, and yard work  PERSONAL FACTORS Profession, Time since onset of injury/illness/exacerbation, and 1-2 comorbidities: HTN and osteoporosis  are also affecting patient's functional outcome.   REHAB POTENTIAL: Good  CLINICAL DECISION MAKING: Evolving/moderate complexity  EVALUATION COMPLEXITY: Moderate   GOALS: Goals reviewed with patient? Yes  LONG TERM GOALS: Target date: 01/02/2022   Patient will be independent with her HEP.  Baseline:  Goal status: Achieved  2.  Patient will be able to complete her daily activities without her familiar pain exceeding 3/10. Baseline:  Goal status: Achieved  3.  Patient will be able to stand and walk for at least 45 minutes without being limited by her familiar left lower leg pain.  Baseline:  Goal status: Achieved  4.  Patient will be able to return to her familiar gym activities without being limited by her familiar pain.   Baseline:  Goal status: Achieved   PLAN: PT FREQUENCY: 2x/week  PT DURATION: 4 weeks  PLANNED INTERVENTIONS: Therapeutic exercises, Therapeutic activity, Neuromuscular re-education, Balance training, Patient/Family education, Dry Needling, Electrical stimulation, Cryotherapy, Moist heat, Taping, Vasopneumatic device, Manual therapy, and Re-evaluation  PLAN FOR NEXT SESSION: recumbent bike, heel/toe raises, STM to anterior tibialis, and modalities as needed   Kathrynn Ducking, PTA 01/01/2022, 10:36 AM   PHYSICAL THERAPY DISCHARGE SUMMARY  Visits from Start of Care: 8  Current functional level related to goals / functional outcomes: Patient was able to meet all of her goals for physical therapy.    Remaining deficits: None    Education / Equipment: HEP    Patient agrees to discharge. Patient goals were met. Patient is being discharged due to meeting the stated rehab goals.  Jacqulynn Cadet, PT, DPT

## 2022-02-19 ENCOUNTER — Ambulatory Visit (INDEPENDENT_AMBULATORY_CARE_PROVIDER_SITE_OTHER): Payer: Medicare Other | Admitting: Family Medicine

## 2022-02-19 ENCOUNTER — Encounter: Payer: Self-pay | Admitting: Family Medicine

## 2022-02-19 VITALS — BP 136/82 | HR 71 | Temp 97.8°F | Ht 61.0 in | Wt 129.6 lb

## 2022-02-19 DIAGNOSIS — G8929 Other chronic pain: Secondary | ICD-10-CM

## 2022-02-19 DIAGNOSIS — I10 Essential (primary) hypertension: Secondary | ICD-10-CM

## 2022-02-19 DIAGNOSIS — R7989 Other specified abnormal findings of blood chemistry: Secondary | ICD-10-CM

## 2022-02-19 DIAGNOSIS — M810 Age-related osteoporosis without current pathological fracture: Secondary | ICD-10-CM | POA: Diagnosis not present

## 2022-02-19 DIAGNOSIS — Z0001 Encounter for general adult medical examination with abnormal findings: Secondary | ICD-10-CM

## 2022-02-19 DIAGNOSIS — E7800 Pure hypercholesterolemia, unspecified: Secondary | ICD-10-CM | POA: Diagnosis not present

## 2022-02-19 DIAGNOSIS — R6889 Other general symptoms and signs: Secondary | ICD-10-CM | POA: Diagnosis not present

## 2022-02-19 DIAGNOSIS — M19049 Primary osteoarthritis, unspecified hand: Secondary | ICD-10-CM | POA: Diagnosis not present

## 2022-02-19 DIAGNOSIS — Z23 Encounter for immunization: Secondary | ICD-10-CM

## 2022-02-19 DIAGNOSIS — D692 Other nonthrombocytopenic purpura: Secondary | ICD-10-CM | POA: Diagnosis not present

## 2022-02-19 DIAGNOSIS — R7889 Finding of other specified substances, not normally found in blood: Secondary | ICD-10-CM | POA: Diagnosis not present

## 2022-02-19 DIAGNOSIS — Z0000 Encounter for general adult medical examination without abnormal findings: Secondary | ICD-10-CM

## 2022-02-19 DIAGNOSIS — M25512 Pain in left shoulder: Secondary | ICD-10-CM

## 2022-02-19 DIAGNOSIS — M79605 Pain in left leg: Secondary | ICD-10-CM | POA: Diagnosis not present

## 2022-02-19 MED ORDER — LISINOPRIL 20 MG PO TABS: 20.0000 mg | ORAL_TABLET | Freq: Every day | ORAL | 3 refills | Status: DC

## 2022-02-19 MED ORDER — PRAVASTATIN SODIUM 40 MG PO TABS: 40.0000 mg | ORAL_TABLET | Freq: Every day | ORAL | 3 refills | Status: DC

## 2022-02-19 MED ORDER — AMLODIPINE BESYLATE 5 MG PO TABS: 5.0000 mg | ORAL_TABLET | Freq: Every day | ORAL | 3 refills | Status: DC

## 2022-02-19 MED ORDER — ALENDRONATE SODIUM 70 MG PO TABS: 70.0000 mg | ORAL_TABLET | ORAL | 3 refills | Status: DC

## 2022-02-19 NOTE — Progress Notes (Signed)
Ann Bolton is a 76 y.o. female presents to office today for annual physical exam examination.    Concerns today include: 1.  Leg pain/ easy bruising/ shoulder pain Patient reports leg pain.  She worries about peripheral artery disease as her father suffered from this.  She has varicose veins.  She does not report any claudication symptoms per se.  She leads a fairly healthy diet and this is plant-based.  She hydrates adequately but admits that she continues to struggle with dry skin.  She has easy bruising and wonders if there is anything that she could do with this.  Reports polyarthralgia, particularly in bilateral hands.  She denies any known family history of autoimmune disease.  Not using any oral NSAIDs but she has used some Tylenol.  2.  Hypertension Patient reports compliance with lisinopril 20 mg daily.  No reports of chest pain, shortness of breath or dizziness.  Blood pressures remain uncontrolled typically in the 140s to 150s over 80s at home.  She tries to lead a fairly healthy lifestyle but does sometimes eat salt rich foods.  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  Diet: plant based, Exercise: regular Last eye exam: UTD Last dental exam: UTD Last colonoscopy: UTD Last mammogram: UTD Last pap smear: n/a Refills needed today: all Immunizations needed:  Immunization History  Administered Date(s) Administered   Fluad Quad(high Dose 65+) 01/24/2019, 02/17/2020, 02/17/2021   Influenza, High Dose Seasonal PF 03/09/2016, 02/25/2018   Influenza-Unspecified 02/25/2017, 01/27/2019   Moderna Covid-19 Vaccine Bivalent Booster 8yr & up 05/30/2021   Moderna SARS-COV2 Booster Vaccination 03/16/2020   Moderna Sars-Covid-2 Vaccination 06/11/2019, 07/17/2019, 11/08/2020   Pneumococcal Conjugate-13 03/09/2016   Pneumococcal Polysaccharide-23 03/31/2018   Tdap 02/17/2019   Zoster Recombinat (Shingrix) 01/23/2017, 04/30/2017     Past Medical History:  Diagnosis Date    Allergy    DDD (degenerative disc disease), lumbar    Hyperlipidemia    Hypertension    Osteoporosis    Social History   Socioeconomic History   Marital status: Widowed    Spouse name: Not on file   Number of children: 3   Years of education: Not on file   Highest education level: Not on file  Occupational History   Occupation: retired    Comment: banking   Tobacco Use   Smoking status: Never   Smokeless tobacco: Never  Vaping Use   Vaping Use: Never used  Substance and Sexual Activity   Alcohol use: Yes    Comment: occ   Drug use: No   Sexual activity: Not on file  Other Topics Concern   Not on file  Social History Narrative   She lives alone - one level. 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   Social Determinants of Health   Financial Resource Strain: Low Risk  (11/03/2021)   Overall Financial Resource Strain (CARDIA)    Difficulty of Paying Living Expenses: Not hard at all  Food Insecurity: No Food Insecurity (11/03/2021)   Hunger Vital Sign    Worried About Running Out of Food in the Last Year: Never true    Ran Out of Food in the Last Year: Never true  Transportation Needs: No Transportation Needs (11/03/2021)   PRAPARE - THydrologist(Medical): No    Lack of Transportation (Non-Medical): No  Physical Activity: Sufficiently Active (  11/03/2021)   Exercise Vital Sign    Days of Exercise per Week: 7 days    Minutes of Exercise per Session: 30 min  Stress: No Stress Concern Present (11/03/2021)   [place]    Feeling of Stress : Only a little  Social Connections: Moderately Isolated (11/03/2021)   Social Connection and Isolation Panel [NHANES]    Frequency of Communication with Friends and Family: More than three times a week    Frequency of Social  Gatherings with Friends and Family: More than three times a week    Attends Religious Services: Never    Marine scientist or Organizations: Yes    Attends Music therapist: More than 4 times per year    Marital Status: Divorced  Intimate Partner Violence: Not At Risk (11/03/2021)   Humiliation, Afraid, Rape, and Kick questionnaire    Fear of Current or Ex-Partner: No    Emotionally Abused: No    Physically Abused: No    Sexually Abused: No   Past Surgical History:  Procedure Laterality Date   CATARACT EXTRACTION, BILATERAL     Family History  Problem Relation Age of Onset   Cancer Mother        Brain and lung    Hypertension Mother    Heart disease Father    Other Brother        died in war    Allergies Daughter    Menstrual problems Daughter    Breast cancer Daughter    Allergies Son    Heart disease Maternal Grandmother    Leukemia Maternal Grandfather    Dementia Paternal Grandmother    Other Paternal Grandfather        diptheria    Mental illness Brother    Obesity Brother    Allergies Son     Current Outpatient Medications:    alendronate (FOSAMAX) 70 MG tablet, TAKE 1 TABLET BY MOUTH  WEEKLY WITH 8 OZ OF PLAIN  WATER 30 MINUTES BEFORE  FIRST FOOD, [place] OR MEDS.  STAY UPRIGHT FOR 30 MINS, Disp: 12 tablet, Rfl: 1   b complex vitamins capsule, Take 1 capsule by mouth daily., Disp: , Rfl:    Calcium 600-200 MG-UNIT tablet, Take 1 tablet by mouth daily., Disp: , Rfl:    cholecalciferol (VITAMIN D) 1000 units tablet, Take 1,000 Units by mouth daily., Disp: , Rfl:    LIDO-MENTHOL-METHYL SAL-CAMPH EX, Apply topically., Disp: , Rfl:    lisinopril (ZESTRIL) 20 MG tablet, Take 1 tablet (20 mg total) by mouth daily., Disp: 90 tablet, Rfl: 3   multivitamin-iron-minerals-folic acid (CENTRUM) chewable tablet, Chew 1 tablet by mouth daily., Disp: , Rfl:    pravastatin (PRAVACHOL) 40 MG tablet, TAKE 1 TABLET BY MOUTH  DAILY, Disp: 100 tablet, Rfl: 2   XIIDRA 5 %  SOLN, Place 1 drop into both eyes 2 (two) times daily. , Disp: , Rfl:   No Known Allergies   ROS: Review of Systems A comprehensive review of systems was negative except for: Integument/breast: positive for dryness Hematologic/lymphatic: positive for bleeding and easy bruising Musculoskeletal: positive for arthralgias, muscle weakness, and stiff joints    Physical exam BP 136/82   Pulse 71   Temp 97.8 F (36.6 C)   Ht 5' 1"  (1.549 m)   Wt 129 lb 9.6 oz (58.8 kg)   SpO2 97%   BMI 24.49 kg/m  General appearance: alert, cooperative, appears stated age, and no  distress Head: Normocephalic, without obvious abnormality, atraumatic Eyes: negative findings: lids and lashes normal, conjunctivae and sclerae normal, corneas clear, and pupils equal, round, reactive to light and accomodation Ears: normal TM's and external ear canals both ears Nose: Nares normal. Septum midline. Mucosa normal. No drainage or sinus tenderness. Throat: lips, mucosa, and tongue normal; teeth and gums normal Neck: no adenopathy, no carotid bruit, supple, symmetrical, trachea midline, and thyroid not enlarged, symmetric, no tenderness/mass/nodules Back: symmetric, no curvature. ROM normal. No CVA tenderness. Lungs: clear to auscultation bilaterally Heart: regular rate and rhythm, S1, S2 normal, no murmur, click, rub or gallop Abdomen: soft, non-tender; bowel sounds normal; no masses,  no organomegaly Extremities:  Osteoarthritic changes noted to the hands bilaterally.  She had no gross swelling, erythema or warmth of the joints however.  No deviation of the hands noted. Pulses: 2+ and symmetric Skin:  Senile purpura noted in the upper extremity. Lymph nodes: Cervical, supraclavicular, and axillary nodes normal. Neurologic: Grossly normal Psych: Mood stable, speech normal, affect appropriate   Assessment/ Plan: Reyne Dumas here for annual physical exam.   Annual physical exam  Essential hypertension -  Plan: CMP14+EGFR, [place] Lower Ext Art Bilat, amLODipine (NORVASC) 5 MG tablet, lisinopril (ZESTRIL) 20 MG tablet  Pure hypercholesterolemia - Plan: Lipid Panel, TSH, [place] Lower Ext Art Bilat  Age-related osteoporosis without current pathological fracture - Plan: CMP14+EGFR, CBC, VITAMIN D 25 Hydroxy (Vit-D Deficiency, Fractures), alendronate (FOSAMAX) 70 MG tablet  Chronic left shoulder pain - Plan: ANA w/Reflex if Positive, C-reactive protein, Sedimentation Rate, Rheumatoid factor  Pain of left lower extremity - Plan: [place] Lower Ext Art Bilat  Hand arthritis - Plan: ANA w/Reflex if Positive, C-reactive protein, Sedimentation Rate, Rheumatoid factor  Purpura senilis (HCC)  Need for immunization against influenza - Plan: Flu Vaccine QUAD High Dose(Fluad)  Fasting labs obtained today.  Blood pressure remains uncontrolled despite increased dose of lisinopril last visit.  Add Norvasc 5 mg daily.  Should she have some peripheral artery disease that should help with flow to the extremities as well.  Continue to monitor blood pressures with goal less than 150/90.  Plan to follow-up in the next 4 to 6 weeks for reevaluation  Fasting lipid, TSH ordered.  ABIs also ordered given reports of bilateral lower extremity concern for peripheral artery disease.  However, posterior tibial pulses were present on exam.  Varicose veins were indeed seen but not very large nor ulcerated.  Vitamin D, calcium level and renal function ordered today.  Continue Fosamax.  Up-to-date on DEXA screen through 2025  Check ANA, CRP, sed rate and rheumatoid factor given reports of chronic arthritic changes in the hands and shoulder.  I suspect that these are osteoarthritis and have recommended Tylenol arthritis up to 3 times daily.  If no significant improvement in the next few weeks, we can consider adding oral NSAID but I want her blood pressure to be controlled prior to initiation of anything like that  We discussed appropriate  senilis is typical of her age and is likely reflective of thinning skin.  CBC was ordered today to evaluate platelet level however  Influenza vaccination administered  Skye Plamondon M. Lajuana Ripple, DO

## 2022-02-20 LAB — LIPID PANEL
Chol/HDL Ratio: 2.7 ratio (ref 0.0–4.4)
Cholesterol, Total: 179 mg/dL (ref 100–199)
HDL: 66 mg/dL (ref >39)
LDL Chol Calc (NIH): 96 mg/dL (ref 0–99)
Triglycerides: 94 mg/dL (ref 0–149)
VLDL Cholesterol Cal: 17 mg/dL (ref 5–40)

## 2022-02-20 LAB — CBC
Hematocrit: 43.7 % (ref 34.0–46.6)
Hemoglobin: 14.8 g/dL (ref 11.1–15.9)
MCH: 32.1 pg (ref 26.6–33.0)
MCHC: 33.9 g/dL (ref 31.5–35.7)
MCV: 95 fL (ref 79–97)
Platelets: 210 10*3/uL (ref 150–450)
RBC: 4.61 x10E6/uL (ref 3.77–5.28)
RDW: 12.5 % (ref 11.7–15.4)
WBC: 5.2 10*3/uL (ref 3.4–10.8)

## 2022-02-20 LAB — CMP14+EGFR
ALT: 6 IU/L (ref 0–32)
AST: 24 IU/L (ref 0–40)
Albumin/Globulin Ratio: 2.1 (ref 1.2–2.2)
Albumin: 4.4 g/dL (ref 3.8–4.8)
Alkaline Phosphatase: 70 IU/L (ref 44–121)
BUN/Creatinine Ratio: 22 (ref 12–28)
BUN: 17 mg/dL (ref 8–27)
Bilirubin Total: 0.6 mg/dL (ref 0.0–1.2)
CO2: 25 mmol/L (ref 20–29)
Calcium: 10 mg/dL (ref 8.7–10.3)
Chloride: 103 mmol/L (ref 96–106)
Creatinine, Ser: 0.76 mg/dL (ref 0.57–1.00)
Globulin, Total: 2.1 g/dL (ref 1.5–4.5)
Glucose: 86 mg/dL (ref 70–99)
Potassium: 4.4 mmol/L (ref 3.5–5.2)
Sodium: 142 mmol/L (ref 134–144)
Total Protein: 6.5 g/dL (ref 6.0–8.5)
eGFR: 80 mL/min/{1.73_m2} (ref >59)

## 2022-02-20 LAB — TSH: TSH: 4.66 u[IU]/mL — ABNORMAL HIGH (ref 0.450–4.500)

## 2022-02-20 LAB — C-REACTIVE PROTEIN: CRP: 1 mg/L (ref 0–10)

## 2022-02-20 LAB — ANA W/REFLEX IF POSITIVE: Anti Nuclear Antibody (ANA): NEGATIVE

## 2022-02-20 LAB — SEDIMENTATION RATE: Sed Rate: 2 mm/hr (ref 0–40)

## 2022-02-20 LAB — VITAMIN D 25 HYDROXY (VIT D DEFICIENCY, FRACTURES): Vit D, 25-Hydroxy: 87.2 ng/mL (ref 30.0–100.0)

## 2022-02-20 LAB — RHEUMATOID FACTOR: Rheumatoid fact SerPl-aCnc: <10 IU/mL (ref <14.0)

## 2022-02-20 NOTE — Addendum Note (Signed)
Addended by: Everlean Cherry on: 02/20/2022 03:26 PM   Modules accepted: Orders

## 2022-02-22 LAB — T4, FREE: Free T4: 1.27 ng/dL (ref 0.82–1.77)

## 2022-02-22 LAB — SPECIMEN STATUS REPORT

## 2022-04-11 ENCOUNTER — Encounter (INDEPENDENT_AMBULATORY_CARE_PROVIDER_SITE_OTHER): Payer: Self-pay

## 2022-04-20 ENCOUNTER — Encounter: Payer: Self-pay | Admitting: Family Medicine

## 2022-04-20 ENCOUNTER — Ambulatory Visit (INDEPENDENT_AMBULATORY_CARE_PROVIDER_SITE_OTHER): Payer: Medicare Other | Admitting: Family Medicine

## 2022-04-20 VITALS — BP 131/76 | HR 73 | Temp 97.2°F | Ht 61.0 in | Wt 128.6 lb

## 2022-04-20 DIAGNOSIS — Z1211 Encounter for screening for malignant neoplasm of colon: Secondary | ICD-10-CM

## 2022-04-20 DIAGNOSIS — R5383 Other fatigue: Secondary | ICD-10-CM

## 2022-04-20 DIAGNOSIS — K5904 Chronic idiopathic constipation: Secondary | ICD-10-CM

## 2022-04-20 DIAGNOSIS — M19049 Primary osteoarthritis, unspecified hand: Secondary | ICD-10-CM | POA: Diagnosis not present

## 2022-04-20 DIAGNOSIS — I10 Essential (primary) hypertension: Secondary | ICD-10-CM | POA: Diagnosis not present

## 2022-04-20 NOTE — Progress Notes (Signed)
Subjective: CC: Follow-up hypertension PCP: Raliegh Ip, DO JSU:NHRVAC Ann Bolton is a 76 y.o. female presenting to clinic today for:  1.  Hypertension Norvasc 5 mg added to her lisinopril 20 mg dose due to ongoing hypertension.  She notes that she has since been having some malaise, constipation and cold intolerance.  Of note she had quite extensive lab work which demonstrated normal thyroid labs and no evidence of autoimmune disease.  She is not sure if this symptom is related to the addition of Norvasc or if this is just her typical low.  Due to change in weather and time of year.  She is not having any blood in stool.  An herbal tea typically helps her move her bowels.  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  Hydrates without difficulty.  Maintains a fairly good fiber diet.  Additionally she continues to suffer with interdigital pain.  She does not like this is necessarily at the joints but rather the muscles of the hands.  She does have an orthopedist but she has not yet contacted them, as she was supposed to see them for her shoulder which got better on its own  ROS: Per HPI  No Known Allergies Past Medical History:  Diagnosis Date   Allergy    DDD (degenerative disc disease), lumbar    Hyperlipidemia    Hypertension    Osteoporosis     Current Outpatient Medications:    alendronate (FOSAMAX) 70 MG tablet, Take 1 tablet (70 mg total) by mouth once a week. Take with a full glass of water on an empty stomach., Disp: 12 tablet, Rfl: 3   amLODipine (NORVASC) 5 MG tablet, Take 1 tablet (5 mg total) by mouth daily., Disp: 90 tablet, Rfl: 3   b complex vitamins capsule, Take 1 capsule by mouth daily., Disp: , Rfl:    Calcium 600-200 MG-UNIT tablet, Take 1 tablet by mouth daily., Disp: , Rfl:    cholecalciferol (VITAMIN D) 1000 units tablet, Take 1,000 Units by mouth daily., Disp: , Rfl:     LIDO-MENTHOL-METHYL SAL-CAMPH EX, Apply topically., Disp: , Rfl:    lisinopril (ZESTRIL) 20 MG tablet, Take 1 tablet (20 mg total) by mouth daily., Disp: 90 tablet, Rfl: 3   multivitamin-iron-minerals-folic acid (CENTRUM) chewable tablet, Chew 1 tablet by mouth daily., Disp: , Rfl:    pravastatin (PRAVACHOL) 40 MG tablet, Take 1 tablet (40 mg total) by mouth daily., Disp: 100 tablet, Rfl: 3   XIIDRA 5 % SOLN, Place 1 drop into both eyes 2 (two) times daily. , Disp: , Rfl:  Social History   Socioeconomic History   Marital status: Widowed    Spouse name: Not on file   Number of children: 3   Years of education: Not on file   Highest education level: Not on file  Occupational History   Occupation: retired    Comment: banking   Tobacco Use   Smoking status: Never   Smokeless tobacco: Never  Vaping Use   Vaping Use: Never used  Substance and Sexual Activity   Alcohol use: Yes    Comment: occ   Drug use: No   Sexual activity: Not on file  Other Topics Concern   Not on file  Social History Narrative   She lives alone - one level. 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   Social Determinants of Health   Financial Resource Strain: Low Risk  (11/03/2021)   Overall Financial Resource Strain (CARDIA)    Difficulty of Paying Living Expenses: Not hard at all  Food Insecurity: No Food Insecurity (11/03/2021)   Hunger Vital Sign    Worried About Running Out of Food in the Last Year: Never true    Ran Out of Food in the Last Year: Never true  Transportation Needs: No Transportation Needs (11/03/2021)   PRAPARE - Administrator, Civil Service (Medical): No    Lack of Transportation (Non-Medical): No  Physical Activity: Sufficiently Active (11/03/2021)   Exercise Vital Sign    Days of Exercise per Week: 7 days    Minutes of Exercise per Session: 30 min  Stress:  No Stress Concern Present (11/03/2021)   [employer] of Occupational Health - Occupational Stress Questionnaire    Feeling of Stress : Only a little  Social Connections: Moderately Isolated (11/03/2021)   Social Connection and Isolation Panel [NHANES]    Frequency of Communication with Friends and Family: More than three times a week    Frequency of Social Gatherings with Friends and Family: More than three times a week    Attends Religious Services: Never    Database administrator or Organizations: Yes    Attends Engineer, structural: More than 4 times per year    Marital Status: Divorced  Intimate Partner Violence: Not At Risk (11/03/2021)   Humiliation, Afraid, Rape, and Kick questionnaire    Fear of Current or Ex-Partner: No    Emotionally Abused: No    Physically Abused: No    Sexually Abused: No   Family History  Problem Relation Age of Onset   Cancer Mother        Brain and lung    Hypertension Mother    Heart disease Father    Other Brother        died in war    Allergies Daughter    Menstrual problems Daughter    Breast cancer Daughter    Allergies Son    Heart disease Maternal Grandmother    Leukemia Maternal Grandfather    Dementia Paternal Grandmother    Other Paternal Grandfather        diptheria    Mental illness Brother    Obesity Brother    Allergies Son     Objective: Office vital signs reviewed. BP 131/76   Pulse 73   Temp (!) 97.2 F (36.2 C)   Ht 5\' 1"  (1.549 m)   Wt 128 lb 9.6 oz (58.3 kg)   SpO2 99%   BMI 24.30 kg/m   Physical Examination:  General: Awake, alert, well nourished, No acute distress HEENT: Sclera white.  No exophthalmos.  No goiter Cardio: regular rate and rhythm, S1S2 heard, no murmurs appreciated Pulm: clear to auscultation bilaterally, no wheezes, rhonchi or rales; normal work of breathing on room air GI: Soft, flat, mild epigastric tenderness to palpation.  No palpable masses.  No rebound or guarding.   Abdomen becomes hyperactive with palpation  Assessment/ Plan: 76 y.o. female   Essential hypertension  Screen for colon cancer - Plan: Cologuard  Hand arthritis  Other fatigue  Chronic idiopathic constipation  Blood pressure well controlled with current regimen.  I offered to discontinue the amlodipine though I cannot find anywhere in the literature that this would cause the symptoms that  she is describing.  I have placed an order for Cologuard though she may have technically aged out of this screening test.  We discussed that her polyarthritis cannot be explained by autoimmune disease but I am glad to refer to orthopedics if she would like further evaluation  Continue high-fiber diet, use of vegetables to help with bowel health.  Physical exam was remarkable for mild epigastric tenderness palpation but no rebound, guarding or masses appreciated  No orders of the defined types were placed in this encounter.  No orders of the defined types were placed in this encounter.    Raliegh Ip, DO [facility] [phone]

## 2022-05-02 DIAGNOSIS — Z1211 Encounter for screening for malignant neoplasm of colon: Secondary | ICD-10-CM | POA: Diagnosis not present

## 2022-05-08 ENCOUNTER — Other Ambulatory Visit: Payer: Self-pay | Admitting: Family Medicine

## 2022-05-08 DIAGNOSIS — Z1231 Encounter for screening mammogram for malignant neoplasm of breast: Secondary | ICD-10-CM

## 2022-05-09 LAB — COLOGUARD: COLOGUARD: NEGATIVE

## 2022-05-10 ENCOUNTER — Ambulatory Visit (HOSPITAL_COMMUNITY): Payer: Medicare Other

## 2022-05-25 ENCOUNTER — Ambulatory Visit (HOSPITAL_COMMUNITY)
Admission: RE | Admit: 2022-05-25 | Discharge: 2022-05-25 | Disposition: A | Discharge status: Home / Self Care | Payer: Medicare Other | Source: Ambulatory Visit | Attending: Family Medicine | Admitting: Family Medicine

## 2022-05-25 DIAGNOSIS — M79604 Pain in right leg: Secondary | ICD-10-CM | POA: Diagnosis not present

## 2022-05-25 DIAGNOSIS — I10 Essential (primary) hypertension: Secondary | ICD-10-CM | POA: Diagnosis not present

## 2022-05-25 DIAGNOSIS — M79605 Pain in left leg: Secondary | ICD-10-CM | POA: Insufficient documentation

## 2022-05-25 DIAGNOSIS — E785 Hyperlipidemia, unspecified: Secondary | ICD-10-CM | POA: Diagnosis not present

## 2022-05-25 DIAGNOSIS — E7800 Pure hypercholesterolemia, unspecified: Secondary | ICD-10-CM | POA: Diagnosis not present

## 2022-05-28 ENCOUNTER — Ambulatory Visit
Admission: RE | Admit: 2022-05-28 | Discharge: 2022-05-28 | Disposition: A | Discharge status: Home / Self Care | Payer: Medicare Other | Source: Ambulatory Visit | Attending: Family Medicine | Admitting: Family Medicine

## 2022-05-28 DIAGNOSIS — Z1231 Encounter for screening mammogram for malignant neoplasm of breast: Secondary | ICD-10-CM

## 2022-07-19 ENCOUNTER — Encounter: Payer: Self-pay | Admitting: Radiology

## 2022-10-18 ENCOUNTER — Other Ambulatory Visit: Payer: Self-pay | Admitting: Family Medicine

## 2022-10-18 DIAGNOSIS — I10 Essential (primary) hypertension: Secondary | ICD-10-CM

## 2022-11-07 ENCOUNTER — Ambulatory Visit (INDEPENDENT_AMBULATORY_CARE_PROVIDER_SITE_OTHER): Payer: Medicare Other

## 2022-11-07 ENCOUNTER — Encounter: Payer: Self-pay | Admitting: Orthopedic Surgery

## 2022-11-07 VITALS — Ht 61.0 in | Wt 126.0 lb

## 2022-11-07 DIAGNOSIS — Z0000 Encounter for general adult medical examination without abnormal findings: Secondary | ICD-10-CM | POA: Diagnosis not present

## 2022-11-07 NOTE — Patient Instructions (Signed)
Ann Bolton , Thank you for taking time to come for your Medicare Wellness Visit. I appreciate your ongoing commitment to your health goals. Please review the following plan we discussed and let me know if I can assist you in the future.   These are the goals we discussed:  Goals      Patient Stated     11/02/2019 AWV Goal: Keep All Scheduled Appointments  Over the next year, patient will attend all scheduled appointments with their PCP and any specialists that they see.      Prevent falls     Stay active         This is a list of the screening recommended for you and due dates:  Health Maintenance  Topic Date Due   COVID-19 Vaccine (6 - 2023-24 season) 01/19/2022   Flu Shot  12/20/2022   Mammogram  05/29/2023   Medicare Annual Wellness Visit  11/07/2023   DEXA scan (bone density measurement)  11/09/2023   DTaP/Tdap/Td vaccine (2 - Td or Tdap) 02/16/2029   Pneumonia Vaccine  Completed   Hepatitis C Screening  Completed   Zoster (Shingles) Vaccine  Completed   HPV Vaccine  Aged Out    Advanced directives: In Chart   Conditions/risks identified: Aim for 30 minutes of exercise or brisk walking, 6-8 glasses of water, and 5 servings of fruits and vegetables each day.   Next appointment: Follow up in one year for your annual wellness visit    Preventive Care 65 Years and Older, Female Preventive care refers to lifestyle choices and visits with your health care provider that can promote health and wellness. What does preventive care include? A yearly physical exam. This is also called an annual well check. Dental exams once or twice a year. Routine eye exams. Ask your health care provider how often you should have your eyes checked. Personal lifestyle choices, including: Daily care of your teeth and gums. Regular physical activity. Eating a healthy diet. Avoiding tobacco and drug use. Limiting alcohol use. Practicing safe sex. Taking low-dose aspirin every day. Taking  vitamin and mineral supplements as recommended by your health care provider. What happens during an annual well check? The services and screenings done by your health care provider during your annual well check will depend on your age, overall health, lifestyle risk factors, and family history of disease. Counseling  Your health care provider may ask you questions about your: Alcohol use. Tobacco use. Drug use. Emotional well-being. Home and relationship well-being. Sexual activity. Eating habits. History of falls. Memory and ability to understand (cognition). Work and work Astronomer. Reproductive health. Screening  You may have the following tests or measurements: Height, weight, and BMI. Blood pressure. Lipid and cholesterol levels. These may be checked every 5 years, or more frequently if you are over 22 years old. Skin check. Lung cancer screening. You may have this screening every year starting at age 67 if you have a 30-pack-year history of smoking and currently smoke or have quit within the past 15 years. Fecal occult blood test (FOBT) of the stool. You may have this test every year starting at age 16. Flexible sigmoidoscopy or colonoscopy. You may have a sigmoidoscopy every 5 years or a colonoscopy every 10 years starting at age 35. Hepatitis C blood test. Hepatitis B blood test. Sexually transmitted disease (STD) testing. Diabetes screening. This is done by checking your blood sugar (glucose) after you have not eaten for a while (fasting). You may have this done  every 1-3 years. Bone density scan. This is done to screen for osteoporosis. You may have this done starting at age 31. Mammogram. This may be done every 1-2 years. Talk to your health care provider about how often you should have regular mammograms. Talk with your health care provider about your test results, treatment options, and if necessary, the need for more tests. Vaccines  Your health care provider may  recommend certain vaccines, such as: Influenza vaccine. This is recommended every year. Tetanus, diphtheria, and acellular pertussis (Tdap, Td) vaccine. You may need a Td booster every 10 years. Zoster vaccine. You may need this after age 37. Pneumococcal 13-valent conjugate (PCV13) vaccine. One dose is recommended after age 23. Pneumococcal polysaccharide (PPSV23) vaccine. One dose is recommended after age 47. Talk to your health care provider about which screenings and vaccines you need and how often you need them. This information is not intended to replace advice given to you by your health care provider. Make sure you discuss any questions you have with your health care provider. Document Released: 06/03/2015 Document Revised: 01/25/2016 Document Reviewed: 03/08/2015 Elsevier Interactive Patient Education  2017 [place] Prevention in the Home Falls can cause injuries. They can happen to people of all ages. There are many things you can do to make your home safe and to help prevent falls. What can I do on the outside of my home? Regularly fix the edges of walkways and driveways and fix any cracks. Remove anything that might make you trip as you walk through a door, such as a raised step or threshold. Trim any bushes or trees on the path to your home. Use bright outdoor lighting. Clear any walking paths of anything that might make someone trip, such as rocks or tools. Regularly check to see if handrails are loose or broken. Make sure that both sides of any steps have handrails. Any raised decks and porches should have guardrails on the edges. Have any leaves, snow, or ice cleared regularly. Use sand or salt on walking paths during winter. Clean up any spills in your garage right away. This includes oil or grease spills. What can I do in the bathroom? Use night lights. Install grab bars by the toilet and in the tub and shower. Do not use towel bars as grab bars. Use non-skid  mats or decals in the tub or shower. If you need to sit down in the shower, use a plastic, non-slip stool. Keep the floor dry. Clean up any water that spills on the floor as soon as it happens. Remove soap buildup in the tub or shower regularly. Attach bath mats securely with double-sided non-slip rug tape. Do not have throw rugs and other things on the floor that can make you trip. What can I do in the bedroom? Use night lights. Make sure that you have a light by your bed that is easy to reach. Do not use any sheets or blankets that are too big for your bed. They should not hang down onto the floor. Have a firm chair that has side arms. You can use this for support while you get dressed. Do not have throw rugs and other things on the floor that can make you trip. What can I do in the kitchen? Clean up any spills right away. Avoid walking on wet floors. Keep items that you use a lot in easy-to-reach places. If you need to reach something above you, use a strong step stool that has  a grab bar. Keep electrical cords out of the way. Do not use floor polish or wax that makes floors slippery. If you must use wax, use non-skid floor wax. Do not have throw rugs and other things on the floor that can make you trip. What can I do with my stairs? Do not leave any items on the stairs. Make sure that there are handrails on both sides of the stairs and use them. Fix handrails that are broken or loose. Make sure that handrails are as long as the stairways. Check any carpeting to make sure that it is firmly attached to the stairs. Fix any carpet that is loose or worn. Avoid having throw rugs at the top or bottom of the stairs. If you do have throw rugs, attach them to the floor with carpet tape. Make sure that you have a light switch at the top of the stairs and the bottom of the stairs. If you do not have them, ask someone to add them for you. What else can I do to help prevent falls? Wear shoes  that: Do not have high heels. Have rubber bottoms. Are comfortable and fit you well. Are closed at the toe. Do not wear sandals. If you use a stepladder: Make sure that it is fully opened. Do not climb a closed stepladder. Make sure that both sides of the stepladder are locked into place. Ask someone to hold it for you, if possible. Clearly mark and make sure that you can see: Any grab bars or handrails. First and last steps. Where the edge of each step is. Use tools that help you move around (mobility aids) if they are needed. These include: Canes. Walkers. Scooters. Crutches. Turn on the lights when you go into a dark area. Replace any light bulbs as soon as they burn out. Set up your furniture so you have a clear path. Avoid moving your furniture around. If any of your floors are uneven, fix them. If there are any pets around you, be aware of where they are. Review your medicines with your doctor. Some medicines can make you feel dizzy. This can increase your chance of falling. Ask your doctor what other things that you can do to help prevent falls. This information is not intended to replace advice given to you by your health care provider. Make sure you discuss any questions you have with your health care provider. Document Released: 03/03/2009 Document Revised: 10/13/2015 Document Reviewed: 06/11/2014 Elsevier Interactive Patient Education  2017 [employer].

## 2022-11-07 NOTE — Progress Notes (Signed)
Subjective:   Ann Bolton is a 77 y.o. female who presents for Medicare Annual (Subsequent) preventive examination.  Visit Complete: Virtual  I connected with  [name] on 11/07/22 by a audio enabled telemedicine application and verified that I am speaking with the correct person using two identifiers.  Patient Location: Home  Provider Location: Home Office  I discussed the limitations of evaluation and management by telemedicine. The patient expressed understanding and agreed to proceed.  Patient Medicare AWV questionnaire was completed by the patient on 11/07/2022; I have confirmed that all information answered by patient is correct and no changes since this date.  Review of Systems           Objective:    Today's Vitals   11/07/22 1208  Weight: 126 lb (57.2 kg)  Height: 5\' 1"  (1.549 m)   Body mass index is 23.81 kg/m.     11/07/2022   12:12 PM 12/04/2021    9:54 AM 11/03/2021   11:28 AM 11/02/2019    9:43 AM 10/17/2018    2:40 PM  Advanced Directives  Does Patient Have a Medical Advance Directive? Yes Yes Yes Yes Yes  Type of Estate agent of [place];Living will  Healthcare Power of [place];Living will Healthcare Power of [place];Living will;Out of facility DNR (pink MOST or yellow form) Healthcare Power of [place];Living will  Does patient want to make changes to medical advance directive? No - Patient declined   No - Patient declined No - Patient declined  Copy of Healthcare Power of Attorney in Chart? Yes - validated most recent copy scanned in chart (See row information)  Yes - validated most recent copy scanned in chart (See row information) Yes - validated most recent copy scanned in chart (See row information) No - copy requested    Current Medications (verified) Outpatient Encounter Medications as of 11/07/2022  Medication Sig   alendronate (FOSAMAX) 70 MG tablet Take 1 tablet (70 mg total) by mouth once a week. Take with a full  glass of water on an empty stomach.   amLODipine (NORVASC) 5 MG tablet TAKE 1 TABLET BY MOUTH DAILY   b complex vitamins capsule Take 1 capsule by mouth daily.   Calcium 600-200 MG-UNIT tablet Take 1 tablet by mouth daily.   cholecalciferol (VITAMIN D) 1000 units tablet Take 1,000 Units by mouth daily.   LIDO-MENTHOL-METHYL SAL-CAMPH EX Apply topically.   lisinopril (ZESTRIL) 20 MG tablet Take 1 tablet (20 mg total) by mouth daily.   multivitamin-iron-minerals-folic acid (CENTRUM) chewable tablet Chew 1 tablet by mouth daily.   pravastatin (PRAVACHOL) 40 MG tablet Take 1 tablet (40 mg total) by mouth daily.   XIIDRA 5 % SOLN Place 1 drop into both eyes 2 (two) times daily.    No facility-administered encounter medications on file as of 11/07/2022.    Allergies (verified) Patient has no known allergies.   History: Past Medical History:  Diagnosis Date   Allergy    DDD (degenerative disc disease), lumbar    Hyperlipidemia    Hypertension    Osteoporosis    Past Surgical History:  Procedure Laterality Date   CATARACT EXTRACTION, BILATERAL     Family History  Problem Relation Age of Onset   Cancer Mother        Brain and lung    Hypertension Mother    Heart disease Father    Other Brother        died in war    Allergies  Daughter    Menstrual problems Daughter    Breast cancer Daughter    Allergies Son    Heart disease Maternal Grandmother    Leukemia Maternal Grandfather    Dementia Paternal Grandmother    Other Paternal Grandfather        diptheria    Mental illness Brother    Obesity Brother    Allergies Son    Social History   Socioeconomic History   Marital status: Widowed    Spouse name: Not on file   Number of children: 3   Years of education: Not on file   Highest education level: Not on file  Occupational History   Occupation: retired    Comment: banking   Tobacco Use   Smoking status: Never   Smokeless tobacco: Never  Vaping Use   Vaping Use:  Never used  Substance and Sexual Activity   Alcohol use: Yes    Comment: occ   Drug use: No   Sexual activity: Not on file  Other Topics Concern   Not on file  Social History Narrative   She lives alone - one level. 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   Social Determinants of Health   Financial Resource Strain: Low Risk  (11/07/2022)   Overall Financial Resource Strain (CARDIA)    Difficulty of Paying Living Expenses: Not hard at all  Food Insecurity: No Food Insecurity (11/07/2022)   Hunger Vital Sign    Worried About Running Out of Food in the Last Year: Never true    Ran Out of Food in the Last Year: Never true  Transportation Needs: No Transportation Needs (11/07/2022)   PRAPARE - Administrator, Civil Service (Medical): No    Lack of Transportation (Non-Medical): No  Physical Activity: Insufficiently Active (11/07/2022)   Exercise Vital Sign    Days of Exercise per Week: 3 days    Minutes of Exercise per Session: 30 min  Stress: No Stress Concern Present (11/07/2022)   [employer] of Occupational Health - Occupational Stress Questionnaire    Feeling of Stress : Not at all  Social Connections: Moderately Isolated (11/07/2022)   Social Connection and Isolation Panel [NHANES]    Frequency of Communication with Friends and Family: More than three times a week    Frequency of Social Gatherings with Friends and Family: More than three times a week    Attends Religious Services: Never    Database administrator or Organizations: Yes    Attends Engineer, structural: More than 4 times per year    Marital Status: Widowed    Tobacco Counseling Counseling given: Not Answered   Clinical Intake:  Pre-visit preparation completed: Yes  Pain : No/denies pain     Nutritional Risks: None Diabetes: No  How often do you need to have  someone help you when you read instructions, pamphlets, or other written materials from your doctor or pharmacy?: 1 - Never  Interpreter Needed?: No  Information entered by :: Renie Ora, LPN   Activities of Daily Living    11/03/2022   10:37 AM  In your present state of health, do you have any difficulty performing the following activities:  Hearing? 0  Vision? 0  Difficulty concentrating or making decisions? 0  Walking or climbing stairs? 0  Dressing or bathing? 0  Doing errands, shopping? 0  Preparing  Food and eating ? N  Using the Toilet? N  In the past six months, have you accidently leaked urine? Y  Do you have problems with loss of bowel control? N  Managing your Medications? N  Managing your Finances? N  Housekeeping or managing your Housekeeping? N    Patient Care Team: Raliegh Ip, DO as PCP - General (Family Medicine) Adam Phenix, DPM as Consulting Physician (Podiatry) Conley Rolls, Westley Chandler, MD as Referring Physician (Optometry)  Indicate any recent Medical Services you may have received from other than [facility] providers in the past year (date may be approximate).     Assessment:   This is a routine wellness examination for [facility].  Hearing/Vision screen Vision Screening - Comments:: Wears rx glasses - up to date with routine eye exams with  Dr.Lee   Dietary issues and exercise activities discussed:     Goals Addressed             This Visit's Progress    Prevent falls   On track    Stay active        Depression Screen    11/07/2022   12:11 PM 04/20/2022    3:58 PM 02/19/2022   11:51 AM 02/19/2022   11:06 AM 11/03/2021   11:26 AM 04/10/2021    2:36 PM 02/17/2021    2:39 PM  PHQ 2/9 Scores  PHQ - 2 Score 0 0 0 0 0 0 0  PHQ- 9 Score       1    Fall Risk    11/07/2022   12:09 PM 11/03/2022   10:37 AM 04/20/2022    3:58 PM 02/19/2022   11:51 AM 02/19/2022   11:06 AM  Fall Risk   Falls in the past year? 0 0 0 0 0  Number falls in past yr: 0  0     Injury with Fall? 0 0     Risk for fall due to : No Fall Risks      Follow up Falls prevention discussed        MEDICARE RISK AT HOME:  Medicare Risk at Home - 11/07/22 1209     Any stairs in or around the home? No    If so, are there any without handrails? No    Home free of loose throw rugs in walkways, pet beds, electrical cords, etc? Yes    Adequate lighting in your home to reduce risk of falls? Yes    Life alert? No    Use of a cane, walker or w/c? No    Grab bars in the bathroom? Yes    Shower chair or bench in shower? Yes    Elevated toilet seat or a handicapped toilet? Yes             TIMED UP AND GO:  Was the test performed?  No    Cognitive Function:        11/07/2022   12:10 PM 11/03/2021   11:29 AM 11/02/2019    9:49 AM 10/17/2018    2:45 PM  6CIT Screen  What Year? 0 points 0 points 0 points 0 points  What month? 0 points 0 points 0 points 0 points  What time? 0 points 0 points 0 points 0 points  Count back from 20 0 points 0 points 0 points 0 points  Months in reverse 0 points 0 points 0 points 0 points  Repeat phrase 0 points 0 points 0 points 2 points  Total Score 0 points 0 points 0 points 2 points    Immunizations Immunization History  Administered Date(s) Administered   Fluad Quad(high Dose 65+) 01/24/2019, 02/17/2020, 02/17/2021, 02/19/2022   Influenza, High Dose Seasonal PF 03/09/2016, 02/25/2018   Influenza-Unspecified 02/25/2017, 01/27/2019   Moderna Covid-19 Vaccine Bivalent Booster 45yrs & up 05/30/2021   Moderna SARS-COV2 Booster Vaccination 03/16/2020   Moderna Sars-Covid-2 Vaccination 06/11/2019, 07/17/2019, 11/08/2020   Pneumococcal Conjugate-13 03/09/2016   Pneumococcal Polysaccharide-23 03/31/2018   Tdap 02/17/2019   Zoster Recombinat (Shingrix) 01/23/2017, 04/30/2017    TDAP status: Up to date  Flu Vaccine status: Up to date  Pneumococcal vaccine status: Up to date  Covid-19 vaccine status: Completed  vaccines  Qualifies for Shingles Vaccine? Yes   Zostavax completed Yes   Shingrix Completed?: Yes  Screening Tests Health Maintenance  Topic Date Due   COVID-19 Vaccine (6 - 2023-24 season) 01/19/2022   INFLUENZA VACCINE  12/20/2022   MAMMOGRAM  05/29/2023   Medicare Annual Wellness (AWV)  11/07/2023   DEXA SCAN  11/09/2023   DTaP/Tdap/Td (2 - Td or Tdap) 02/16/2029   Pneumonia Vaccine 69+ Years old  Completed   Hepatitis C Screening  Completed   Zoster Vaccines- Shingrix  Completed   HPV VACCINES  Aged Out    Health Maintenance  Health Maintenance Due  Topic Date Due   COVID-19 Vaccine (6 - 2023-24 season) 01/19/2022    Colorectal cancer screening: No longer required.   Mammogram status: No longer required due to age.  Bone Density status: Completed 11/08/2021. Results reflect: Bone density results: OSTEOPOROSIS. Repeat every 2 years.  Lung Cancer Screening: (Low Dose CT Chest recommended if Age 83-80 years, 20 pack-year currently smoking OR have quit w/in 15years.) does not qualify.   Lung Cancer Screening Referral: n/a  Additional Screening:  Hepatitis C Screening: does not qualify; Completed 03/09/2016  Vision Screening: Recommended annual ophthalmology exams for early detection of glaucoma and other disorders of the eye. Is the patient up to date with their annual eye exam?  Yes  Who is the provider or what is the name of the office in which the patient attends annual eye exams? Dr.Lee  If pt is not established with a provider, would they like to be referred to a provider to establish care? No .   Dental Screening: Recommended annual dental exams for proper oral hygiene  Diabetic Foot Exam: Diabetic Foot Exam: Overdue, Pt has been advised about the importance in completing this exam. Pt is scheduled for diabetic foot exam on next office visit .  Community Resource Referral / Chronic Care Management: CRR required this visit?  No   CCM required this visit?   No     Plan:     I have personally reviewed and noted the following in the patient's chart:   Medical and social history Use of alcohol, tobacco or illicit drugs  Current medications and supplements including opioid prescriptions. Patient is not currently taking opioid prescriptions. Functional ability and status Nutritional status Physical activity Advanced directives List of other physicians Hospitalizations, surgeries, and ER visits in previous 12 months Vitals Screenings to include cognitive, depression, and falls Referrals and appointments  In addition, I have reviewed and discussed with patient certain preventive protocols, quality metrics, and best practice recommendations. A written personalized care plan for preventive services as well as general preventive health recommendations were provided to patient.     Lorrene Reid, LPN   08/27/8117   After Visit Summary: (MyChart) Due  to this being a telephonic visit, the after visit summary with patients personalized plan was offered to patient via MyChart   Nurse Notes: none

## 2022-12-01 ENCOUNTER — Other Ambulatory Visit: Payer: Self-pay | Admitting: Family Medicine

## 2022-12-01 DIAGNOSIS — I10 Essential (primary) hypertension: Secondary | ICD-10-CM

## 2022-12-04 ENCOUNTER — Encounter: Payer: Self-pay | Admitting: Family Medicine

## 2022-12-04 ENCOUNTER — Other Ambulatory Visit: Payer: Self-pay | Admitting: Family Medicine

## 2022-12-04 DIAGNOSIS — M810 Age-related osteoporosis without current pathological fracture: Secondary | ICD-10-CM

## 2022-12-04 DIAGNOSIS — I10 Essential (primary) hypertension: Secondary | ICD-10-CM

## 2022-12-04 MED ORDER — LISINOPRIL 40 MG PO TABS: 40.0000 mg | ORAL_TABLET | Freq: Every day | ORAL | 3 refills | Status: DC

## 2023-01-10 ENCOUNTER — Encounter: Payer: Self-pay | Admitting: Family Medicine

## 2023-01-30 ENCOUNTER — Ambulatory Visit (INDEPENDENT_AMBULATORY_CARE_PROVIDER_SITE_OTHER): Payer: Medicare Other | Admitting: Family Medicine

## 2023-01-30 ENCOUNTER — Encounter: Payer: Self-pay | Admitting: Family Medicine

## 2023-01-30 VITALS — BP 137/87 | HR 98 | Temp 96.1°F | Ht 61.0 in | Wt 129.0 lb

## 2023-01-30 DIAGNOSIS — J069 Acute upper respiratory infection, unspecified: Secondary | ICD-10-CM | POA: Diagnosis not present

## 2023-01-30 DIAGNOSIS — H66001 Acute suppurative otitis media without spontaneous rupture of ear drum, right ear: Secondary | ICD-10-CM

## 2023-01-30 MED ORDER — AMOXICILLIN-POT CLAVULANATE 875-125 MG PO TABS: 1.0000 | ORAL_TABLET | Freq: Two times a day (BID) | ORAL | 0 refills | Status: AC

## 2023-01-30 NOTE — Progress Notes (Signed)
Subjective:  Patient ID: Ann Bolton, female    DOB: 1945/09/27, 77 y.o.   MRN: [id number]  Patient Care Team: Raliegh Ip, DO as PCP - General (Family Medicine) Adam Phenix, DPM as Consulting Physician (Podiatry) Conley Rolls, Westley Chandler, MD as Referring Physician (Optometry)   Chief Complaint:  Ear Pain (Right side x 2 days )   HPI: Ann Bolton is a 77 y.o. female presenting on 01/30/2023 for Ear Pain (Right side x 2 days )   URI  This is a new problem. The current episode started yesterday. The problem has been gradually worsening. Associated symptoms include congestion, coughing, ear pain and a plugged ear sensation. Pertinent negatives include no abdominal pain, chest pain, diarrhea, dysuria, headaches, joint pain, joint swelling, nausea, neck pain, rash, rhinorrhea, sinus pain, sneezing, sore throat, swollen glands, vomiting or wheezing. She has tried acetaminophen for the symptoms. The treatment provided no relief.       Relevant past medical, surgical, family, and social history reviewed and updated as indicated.  Allergies and medications reviewed and updated. Data reviewed: Chart in Epic.   Past Medical History:  Diagnosis Date   Allergy    DDD (degenerative disc disease), lumbar    Hyperlipidemia    Hypertension    Osteoporosis     Past Surgical History:  Procedure Laterality Date   CATARACT EXTRACTION, BILATERAL      Social History   Socioeconomic History   Marital status: Widowed    Spouse name: Not on file   Number of children: 3   Years of education: Not on file   Highest education level: Bachelor's degree (e.g., BA, AB, BS)  Occupational History   Occupation: retired    Comment: banking   Tobacco Use   Smoking status: Never   Smokeless tobacco: Never  Vaping Use   Vaping status: Never Used  Substance and Sexual Activity   Alcohol use: Yes    Comment: occ   Drug use: No   Sexual activity: Not on file  Other Topics Concern   Not on  file  Social History Narrative   She lives alone - one level. 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   Social Determinants of Health   Financial Resource Strain: Low Risk  (01/29/2023)   Overall Financial Resource Strain (CARDIA)    Difficulty of Paying Living Expenses: Not hard at all  Food Insecurity: No Food Insecurity (01/29/2023)   Hunger Vital Sign    Worried About Running Out of Food in the Last Year: Never true    Ran Out of Food in the Last Year: Never true  Transportation Needs: No Transportation Needs (01/29/2023)   PRAPARE - Administrator, Civil Service (Medical): No    Lack of Transportation (Non-Medical): No  Physical Activity: Insufficiently Active (01/29/2023)   Exercise Vital Sign    Days of Exercise per Week: 3 days    Minutes of Exercise per Session: 40 min  Stress: No Stress Concern Present (01/29/2023)   [employer] of Occupational Health - Occupational Stress Questionnaire    Feeling of Stress : Only a little  Social Connections: Moderately Isolated (01/29/2023)   Social Connection and Isolation Panel [NHANES]    Frequency of Communication with Friends and Family: Twice a week    Frequency of Social Gatherings with Friends and Family: Three times  a week    Attends Religious Services: Never    Active Member of Clubs or Organizations: Yes    Attends Banker Meetings: More than 4 times per year    Marital Status: Widowed  Intimate Partner Violence: Not At Risk (11/07/2022)   Humiliation, Afraid, Rape, and Kick questionnaire    Fear of Current or Ex-Partner: No    Emotionally Abused: No    Physically Abused: No    Sexually Abused: No    Outpatient Encounter Medications as of 01/30/2023  Medication Sig   alendronate (FOSAMAX) 70 MG tablet TAKE 1 TABLET BY MOUTH ONCE A  WEEK. TAKE WITH A FULL GLASS OF  WATER ON AN  EMPTY STOMACH.   amoxicillin-clavulanate (AUGMENTIN) 875-125 MG tablet Take 1 tablet by mouth 2 (two) times daily for 10 days.   b complex vitamins capsule Take 1 capsule by mouth daily.   Calcium 600-200 MG-UNIT tablet Take 1 tablet by mouth daily.   cholecalciferol (VITAMIN D) 1000 units tablet Take 1,000 Units by mouth daily.   LIDO-MENTHOL-METHYL SAL-CAMPH EX Apply topically.   lisinopril (ZESTRIL) 40 MG tablet Take 1 tablet (40 mg total) by mouth daily. STOP amlodipine. *New dose   multivitamin-iron-minerals-folic acid (CENTRUM) chewable tablet Chew 1 tablet by mouth daily.   pravastatin (PRAVACHOL) 40 MG tablet Take 1 tablet (40 mg total) by mouth daily.   XIIDRA 5 % SOLN Place 1 drop into both eyes 2 (two) times daily.    No facility-administered encounter medications on file as of 01/30/2023.    No Known Allergies  Review of Systems  Constitutional:  Negative for activity change, appetite change, chills, diaphoresis, fatigue, fever and unexpected weight change.  HENT:  Positive for congestion and ear pain. Negative for dental problem, drooling, ear discharge, facial swelling, hearing loss, mouth sores, nosebleeds, postnasal drip, rhinorrhea, sinus pressure, sinus pain, sneezing, sore throat, tinnitus, trouble swallowing and voice change.   Eyes: Negative.   Respiratory:  Positive for cough. Negative for apnea, choking, chest tightness, shortness of breath, wheezing and stridor.   Cardiovascular:  Negative for chest pain, palpitations and leg swelling.  Gastrointestinal:  Negative for abdominal pain, blood in stool, constipation, diarrhea, nausea and vomiting.  Endocrine: Negative.   Genitourinary:  Negative for dysuria, frequency and urgency.  Musculoskeletal:  Negative for arthralgias, joint pain, myalgias and neck pain.  Skin: Negative.  Negative for rash.  Allergic/Immunologic: Negative.   Neurological:  Negative for dizziness, tremors, seizures, syncope, facial asymmetry,  speech difficulty, weakness, light-headedness, numbness and headaches.  Hematological: Negative.   Psychiatric/Behavioral:  Negative for confusion, hallucinations, sleep disturbance and suicidal ideas.   All other systems reviewed and are negative.       Objective:  BP 137/87   Pulse 98   Temp (!) 96.1 F (35.6 C) (Temporal)   Ht 5\' 1"  (1.549 m)   Wt 129 lb (58.5 kg)   SpO2 96%   BMI 24.37 kg/m    Wt Readings from Last 3 Encounters:  01/30/23 129 lb (58.5 kg)  11/07/22 126 lb (57.2 kg)  04/20/22 128 lb 9.6 oz (58.3 kg)    Physical Exam Vitals and nursing note reviewed.  Constitutional:      General: She is not in acute distress.    Appearance: Normal appearance. She is well-developed, well-groomed and normal weight. She is not ill-appearing, toxic-appearing or diaphoretic.  HENT:     Head: Normocephalic and atraumatic.     Jaw: There is normal jaw occlusion.  Right Ear: Hearing normal. Tympanic membrane is erythematous and bulging.     Left Ear: Hearing normal. Tympanic membrane is not erythematous.     Nose: Congestion present. No rhinorrhea.     Mouth/Throat:     Lips: Pink.     Mouth: Mucous membranes are moist.     Pharynx: Oropharynx is clear. Uvula midline.  Eyes:     General: Lids are normal.     Extraocular Movements: Extraocular movements intact.     Conjunctiva/sclera: Conjunctivae normal.     Pupils: Pupils are equal, round, and reactive to light.  Neck:     Thyroid: No thyroid mass, thyromegaly or thyroid tenderness.     Vascular: No JVD.     Trachea: Trachea and phonation normal.  Cardiovascular:     Rate and Rhythm: Normal rate and regular rhythm.     Chest Wall: PMI is not displaced.     Pulses: Normal pulses.     Heart sounds: Normal heart sounds. No murmur heard.    No friction rub. No gallop.  Pulmonary:     Effort: Pulmonary effort is normal. No respiratory distress.     Breath sounds: Normal breath sounds. No wheezing.  Abdominal:      Palpations: Abdomen is soft. There is no splenomegaly.  Musculoskeletal:        General: Normal range of motion.     Cervical back: Normal range of motion and neck supple.     Right lower leg: No edema.     Left lower leg: No edema.  Lymphadenopathy:     Cervical: Cervical adenopathy present.  Skin:    General: Skin is warm and dry.     Capillary Refill: Capillary refill takes less than 2 seconds.     Coloration: Skin is not cyanotic, jaundiced or pale.     Findings: No rash.  Neurological:     General: No focal deficit present.     Mental Status: She is alert and oriented to person, place, and time.     Sensory: Sensation is intact.     Motor: Motor function is intact.     Coordination: Coordination is intact.     Gait: Gait is intact.     Deep Tendon Reflexes: Reflexes are normal and symmetric.  Psychiatric:        Attention and Perception: Attention and perception normal.        Mood and Affect: Mood and affect normal.        Speech: Speech normal.        Behavior: Behavior normal. Behavior is cooperative.        Thought Content: Thought content normal.        Cognition and Memory: Cognition and memory normal.        Judgment: Judgment normal.     Results for orders placed or performed in visit on 04/20/22  Cologuard  Result Value Ref Range   COLOGUARD Negative Negative       Pertinent labs & imaging results that were available during my care of the patient were reviewed by me and considered in my medical decision making.  Assessment & Plan:  Iveliz was seen today for ear pain.  Diagnoses and all orders for this visit:  URI with cough and congestion Non-recurrent acute suppurative otitis media of right ear without spontaneous rupture of tympanic membrane Symptomatic care discussed in detail. Will treat with below. Pt aware to report new, worsening, or persistent symptoms.  -  amoxicillin-clavulanate (AUGMENTIN) 875-125 MG tablet; Take 1 tablet by mouth 2 (two)  times daily for 10 days.     Continue all other maintenance medications.  Follow up plan: Return if symptoms worsen or fail to improve.   Continue healthy lifestyle choices, including diet (rich in fruits, vegetables, and lean proteins, and low in salt and simple carbohydrates) and exercise (at least 30 minutes of moderate physical activity daily).  Educational handout given for URI  The above assessment and management plan was discussed with the patient. The patient verbalized understanding of and has agreed to the management plan. Patient is aware to call the clinic if they develop any new symptoms or if symptoms persist or worsen. Patient is aware when to return to the clinic for a follow-up visit. Patient educated on when it is appropriate to go to the emergency department.   Kari Baars, FNP-C [facility] [phone]

## 2023-02-02 ENCOUNTER — Other Ambulatory Visit: Payer: Self-pay | Admitting: Family Medicine

## 2023-02-02 DIAGNOSIS — M810 Age-related osteoporosis without current pathological fracture: Secondary | ICD-10-CM

## 2023-02-22 ENCOUNTER — Encounter: Payer: Medicare Other | Admitting: Family Medicine

## 2023-02-22 ENCOUNTER — Ambulatory Visit (INDEPENDENT_AMBULATORY_CARE_PROVIDER_SITE_OTHER): Payer: Medicare Other | Admitting: Family Medicine

## 2023-02-22 ENCOUNTER — Encounter: Payer: Self-pay | Admitting: Family Medicine

## 2023-02-22 VITALS — BP 148/84 | HR 76 | Temp 98.2°F | Ht 61.0 in | Wt 126.0 lb

## 2023-02-22 DIAGNOSIS — I10 Essential (primary) hypertension: Secondary | ICD-10-CM | POA: Diagnosis not present

## 2023-02-22 DIAGNOSIS — H6992 Unspecified Eustachian tube disorder, left ear: Secondary | ICD-10-CM

## 2023-02-22 DIAGNOSIS — H7291 Unspecified perforation of tympanic membrane, right ear: Secondary | ICD-10-CM | POA: Diagnosis not present

## 2023-02-22 DIAGNOSIS — B370 Candidal stomatitis: Secondary | ICD-10-CM | POA: Diagnosis not present

## 2023-02-22 DIAGNOSIS — Z0000 Encounter for general adult medical examination without abnormal findings: Secondary | ICD-10-CM

## 2023-02-22 MED ORDER — CLOTRIMAZOLE 10 MG MT TROC: 10.0000 mg | Freq: Every day | OROMUCOSAL | 0 refills | Status: DC

## 2023-02-22 NOTE — Progress Notes (Signed)
Subjective:  Patient ID: Ann Bolton, female    DOB: 1945-06-24, 77 y.o.   MRN: [id number]  Patient Care Team: Raliegh Ip, DO as PCP - General (Family Medicine) Adam Phenix, DPM as Consulting Physician (Podiatry) Conley Rolls, Westley Chandler, MD as Referring Physician (Optometry)   Chief Complaint:  Ear Pain  HPI: Ann Bolton is a 77 y.o. female presenting on 02/22/2023 for Ear Pain  Patient states that symptoms started on 9/3. She was treated on 9/11 by Kari Baars, FNP for AOM with ruptured right TM. She states that she has felt better, but that her symptoms have not fully resolved. States that she still has pain in bilateral ears. Reports sneezing. Denies any cough, fever, rhinorrhea. She is not taking anything OTC other than tylenol which she takes for joint pain as well. She states that she feels like she is in a "fish bowl". In addition patient states that her throat is still "burning" and her tongue feels "weird".   Relevant past medical, surgical, family, and social history reviewed and updated as indicated.  Allergies and medications reviewed and updated. Data reviewed: Chart in Epic.   Past Medical History:  Diagnosis Date   Allergy    DDD (degenerative disc disease), lumbar    Hyperlipidemia    Hypertension    Osteoporosis     Past Surgical History:  Procedure Laterality Date   CATARACT EXTRACTION, BILATERAL      Social History   Socioeconomic History   Marital status: Widowed    Spouse name: Not on file   Number of children: 3   Years of education: Not on file   Highest education level: Bachelor's degree (e.g., BA, AB, BS)  Occupational History   Occupation: retired    Comment: banking   Tobacco Use   Smoking status: Never   Smokeless tobacco: Never  Vaping Use   Vaping status: Never Used  Substance and Sexual Activity   Alcohol use: Yes    Comment: occ   Drug use: No   Sexual activity: Not on file  Other Topics Concern   Not on file   Social History Narrative   She lives alone - one level. 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   Social Determinants of Health   Financial Resource Strain: Low Risk  (01/29/2023)   Overall Financial Resource Strain (CARDIA)    Difficulty of Paying Living Expenses: Not hard at all  Food Insecurity: No Food Insecurity (01/29/2023)   Hunger Vital Sign    Worried About Running Out of Food in the Last Year: Never true    Ran Out of Food in the Last Year: Never true  Transportation Needs: No Transportation Needs (01/29/2023)   PRAPARE - Administrator, Civil Service (Medical): No    Lack of Transportation (Non-Medical): No  Physical Activity: Insufficiently Active (01/29/2023)   Exercise Vital Sign    Days of Exercise per Week: 3 days    Minutes of Exercise per Session: 40 min  Stress: No Stress Concern Present (01/29/2023)   [employer] of Occupational Health - Occupational Stress Questionnaire    Feeling of Stress : Only a little  Social Connections: Moderately Isolated (01/29/2023)   Social Connection and Isolation Panel [NHANES]    Frequency of Communication with Friends and Family: Twice a week    Frequency of Social  Gatherings with Friends and Family: Three times a week    Attends Religious Services: Never    Active Member of Clubs or Organizations: Yes    Attends Banker Meetings: More than 4 times per year    Marital Status: Widowed  Intimate Partner Violence: Not At Risk (11/07/2022)   Humiliation, Afraid, Rape, and Kick questionnaire    Fear of Current or Ex-Partner: No    Emotionally Abused: No    Physically Abused: No    Sexually Abused: No    Outpatient Encounter Medications as of 02/22/2023  Medication Sig   alendronate (FOSAMAX) 70 MG tablet TAKE 1 TABLET BY MOUTH ONCE A  WEEK. TAKE WITH A FULL GLASS OF  WATER ON AN EMPTY  STOMACH.   b complex vitamins capsule Take 1 capsule by mouth daily.   Calcium 600-200 MG-UNIT tablet Take 1 tablet by mouth daily.   cholecalciferol (VITAMIN D) 1000 units tablet Take 1,000 Units by mouth daily.   LIDO-MENTHOL-METHYL SAL-CAMPH EX Apply topically.   lisinopril (ZESTRIL) 40 MG tablet Take 1 tablet (40 mg total) by mouth daily. STOP amlodipine. *New dose   multivitamin-iron-minerals-folic acid (CENTRUM) chewable tablet Chew 1 tablet by mouth daily.   pravastatin (PRAVACHOL) 40 MG tablet Take 1 tablet (40 mg total) by mouth daily.   XIIDRA 5 % SOLN Place 1 drop into both eyes 2 (two) times daily.    No facility-administered encounter medications on file as of 02/22/2023.    No Known Allergies  Review of Systems As per HPI Objective:  BP (!) 148/84   Pulse 76   Temp 98.2 F (36.8 C)   Ht 5\' 1"  (1.549 m)   Wt 126 lb (57.2 kg)   SpO2 96%   BMI 23.81 kg/m    Wt Readings from Last 3 Encounters:  02/22/23 126 lb (57.2 kg)  01/30/23 129 lb (58.5 kg)  11/07/22 126 lb (57.2 kg)   Physical Exam Constitutional:      General: She is awake. She is not in acute distress.    Appearance: Normal appearance. She is well-developed and well-groomed. She is not ill-appearing, toxic-appearing or diaphoretic.  HENT:     Right Ear: Swelling and tenderness present. No laceration or drainage. No middle ear effusion. There is no impacted cerumen. No foreign body. No mastoid tenderness. No PE tube. There is hemotympanum. Tympanic membrane is injected, perforated and erythematous. Tympanic membrane is not scarred, retracted or bulging.     Left Ear: Swelling present. No laceration, drainage or tenderness. A middle ear effusion is present. There is no impacted cerumen. No foreign body. No mastoid tenderness. No PE tube. No hemotympanum. Tympanic membrane is bulging. Tympanic membrane is not injected, scarred, perforated, erythematous or retracted.     Ears:     Comments: Bleeding in canal and  small blood clot on left lower portion of TM    Mouth/Throat:     Lips: Pink. No lesions.     Mouth: Mucous membranes are moist.     Tongue: Lesions present.     Palate: No mass and lesions.     Pharynx: Posterior oropharyngeal erythema present. No pharyngeal swelling, oropharyngeal exudate, uvula swelling or postnasal drip.     Tonsils: No tonsillar exudate or tonsillar abscesses.     Comments: White patches on tongue   Cardiovascular:     Rate and Rhythm: Normal rate and regular rhythm.     Pulses: Normal pulses.  Radial pulses are 2+ on the right side and 2+ on the left side.       Posterior tibial pulses are 2+ on the right side and 2+ on the left side.     Heart sounds: Normal heart sounds. No murmur heard.    No gallop.  Pulmonary:     Effort: Pulmonary effort is normal. No respiratory distress.     Breath sounds: Normal breath sounds. No stridor. No wheezing, rhonchi or rales.  Musculoskeletal:     Cervical back: Full passive range of motion without pain and neck supple.     Right lower leg: No edema.     Left lower leg: No edema.  Skin:    General: Skin is warm.     Capillary Refill: Capillary refill takes less than 2 seconds.  Neurological:     General: No focal deficit present.     Mental Status: She is alert, oriented to person, place, and time and easily aroused. Mental status is at baseline.     GCS: GCS eye subscore is 4. GCS verbal subscore is 5. GCS motor subscore is 6.     Motor: No weakness.  Psychiatric:        Attention and Perception: Attention and perception normal.        Mood and Affect: Mood and affect normal.        Speech: Speech normal.        Behavior: Behavior normal. Behavior is cooperative.        Thought Content: Thought content normal. Thought content does not include homicidal or suicidal ideation. Thought content does not include homicidal or suicidal plan.        Cognition and Memory: Cognition and memory normal.        Judgment:  Judgment normal.     Results for orders placed or performed in visit on 04/20/22  Cologuard  Result Value Ref Range   COLOGUARD Negative Negative       02/22/2023   10:41 AM 11/07/2022   12:11 PM 04/20/2022    3:58 PM 02/19/2022   11:51 AM 02/19/2022   11:06 AM  Depression screen PHQ 2/9  Decreased Interest 0 0 0 0 0  Down, Depressed, Hopeless 0 0 0 0 0  PHQ - 2 Score 0 0 0 0 0  Altered sleeping 0      Tired, decreased energy 0      Change in appetite 0      Feeling bad or failure about yourself  0      Trouble concentrating 0      Moving slowly or fidgety/restless 0      Suicidal thoughts 0      PHQ-9 Score 0           04/20/2022    3:57 PM 02/19/2022   11:51 AM 02/17/2021    2:40 PM 10/24/2020    8:11 AM  GAD 7 : Generalized Anxiety Score  Nervous, Anxious, on Edge 0 0 1 0  Control/stop worrying 0 0 0 0  Worry too much - different things 0 0 0 0  Trouble relaxing 0 0 0 0  Restless 0 0 0 0  Easily annoyed or irritable 0 0 0 0  Afraid - awful might happen 0 0 0 0  Total GAD 7 Score 0 0 1 0  Anxiety Difficulty Not difficult at all Not difficult at all Not difficult at all    Pertinent labs & imaging results that  were available during my care of the patient were reviewed by me and considered in my medical decision making.  Assessment & Plan:  Aliahna was seen today for ear pain.  Diagnoses and all orders for this visit:  1. Perforation of right tympanic membrane Discussed with patient that most perforations heal spontaneously. Discussed with patient to follow up if symptoms do not improve.  2. Eustachian tube dysfunction, left Discussed OTC treatment options such as Zyrtec or Xyzal and flonase.   3. Thrush Medications as below.  - clotrimazole (MYCELEX) 10 MG troche; Take 1 tablet (10 mg total) by mouth 5 (five) times daily for 7 days.  Dispense: 35 tablet; Refill: 0  4. Essential hypertension Patient verbalized that she has a monitor at home and is able to check  her BP at home. She is planning to bring measurements to follow up with PCP next week.     Continue all other maintenance medications.  Follow up plan: Return for follow up with PCP next week.   Continue healthy lifestyle choices, including diet (rich in fruits, vegetables, and lean proteins, and low in salt and simple carbohydrates) and exercise (at least 30 minutes of moderate physical activity daily).  Written and verbal instructions provided   The above assessment and management plan was discussed with the patient. The patient verbalized understanding of and has agreed to the management plan. Patient is aware to call the clinic if they develop any new symptoms or if symptoms persist or worsen. Patient is aware when to return to the clinic for a follow-up visit. Patient educated on when it is appropriate to go to the emergency department.   Neale Burly, DNP-FNP [facility] [address] [place], [place] [address] [phone]

## 2023-02-28 ENCOUNTER — Ambulatory Visit: Payer: Medicare Other | Admitting: Family Medicine

## 2023-02-28 ENCOUNTER — Encounter: Payer: Self-pay | Admitting: Family Medicine

## 2023-02-28 VITALS — BP 146/88 | HR 76 | Temp 98.4°F | Ht 61.0 in | Wt 125.0 lb

## 2023-02-28 DIAGNOSIS — D692 Other nonthrombocytopenic purpura: Secondary | ICD-10-CM

## 2023-02-28 DIAGNOSIS — N3946 Mixed incontinence: Secondary | ICD-10-CM | POA: Diagnosis not present

## 2023-02-28 DIAGNOSIS — Z130 Encounter for screening for diseases of the blood and blood-forming organs and certain disorders involving the immune mechanism: Secondary | ICD-10-CM

## 2023-02-28 DIAGNOSIS — Z23 Encounter for immunization: Secondary | ICD-10-CM | POA: Diagnosis not present

## 2023-02-28 DIAGNOSIS — Z0000 Encounter for general adult medical examination without abnormal findings: Secondary | ICD-10-CM | POA: Diagnosis not present

## 2023-02-28 DIAGNOSIS — M810 Age-related osteoporosis without current pathological fracture: Secondary | ICD-10-CM

## 2023-02-28 DIAGNOSIS — I10 Essential (primary) hypertension: Secondary | ICD-10-CM

## 2023-02-28 DIAGNOSIS — E7800 Pure hypercholesterolemia, unspecified: Secondary | ICD-10-CM | POA: Diagnosis not present

## 2023-02-28 MED ORDER — PRAVASTATIN SODIUM 40 MG PO TABS
40.0000 mg | ORAL_TABLET | Freq: Every day | ORAL | 3 refills | Status: DC
Start: 2023-02-28 — End: 2024-02-28

## 2023-02-28 MED ORDER — LISINOPRIL 40 MG PO TABS
40.0000 mg | ORAL_TABLET | Freq: Every day | ORAL | 3 refills | Status: DC
Start: 2023-02-28 — End: 2024-01-16

## 2023-02-28 MED ORDER — OXYBUTYNIN CHLORIDE ER 5 MG PO TB24
5.0000 mg | ORAL_TABLET | Freq: Every day | ORAL | 3 refills | Status: DC
Start: 2023-02-28 — End: 2024-02-20

## 2023-02-28 MED ORDER — ALENDRONATE SODIUM 70 MG PO TABS
70.0000 mg | ORAL_TABLET | ORAL | 3 refills | Status: DC
Start: 2023-02-28 — End: 2023-12-13

## 2023-02-28 NOTE — Progress Notes (Signed)
Ann Bolton is a 77 y.o. female presents to office today for annual physical exam examination.    Concerns today include: 1.  None.  She reports that symptoms are resolving from her thrush.  Occupation: Retired, Substance use: None Health Maintenance Due  Topic Date Due   INFLUENZA VACCINE  12/20/2022   Refills needed today: All  Immunization History  Administered Date(s) Administered   Fluad Quad(high Dose 65+) 01/24/2019, 02/17/2020, 02/17/2021, 02/19/2022   Influenza, High Dose Seasonal PF 03/09/2016, 02/25/2018   Influenza-Unspecified 02/25/2017, 01/27/2019   Moderna Covid-19 Vaccine Bivalent Booster 31yrs & up 05/30/2021   Moderna SARS-COV2 Booster Vaccination 03/16/2020   Moderna Sars-Covid-2 Vaccination 06/11/2019, 07/17/2019, 11/08/2020   Pneumococcal Conjugate-13 03/09/2016   Pneumococcal Polysaccharide-23 03/31/2018   Tdap 02/17/2019   Zoster Recombinant(Shingrix) 01/23/2017, 04/30/2017   Past Medical History:  Diagnosis Date   Allergy    DDD (degenerative disc disease), lumbar    Hyperlipidemia    Hypertension    Osteoporosis    Social History   Socioeconomic History   Marital status: Widowed    Spouse name: Not on file   Number of children: 3   Years of education: Not on file   Highest education level: Bachelor's degree (e.g., BA, AB, BS)  Occupational History   Occupation: retired    Comment: banking   Tobacco Use   Smoking status: Never   Smokeless tobacco: Never  Vaping Use   Vaping status: Never Used  Substance and Sexual Activity   Alcohol use: Yes    Comment: occ   Drug use: No   Sexual activity: Not on file  Other Topics Concern   Not on file  Social History Narrative   She lives alone - one level. 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   Social Determinants of Health   Financial Resource Strain: Low Risk   (01/29/2023)   Overall Financial Resource Strain (CARDIA)    Difficulty of Paying Living Expenses: Not hard at all  Food Insecurity: No Food Insecurity (01/29/2023)   Hunger Vital Sign    Worried About Running Out of Food in the Last Year: Never true    Ran Out of Food in the Last Year: Never true  Transportation Needs: No Transportation Needs (01/29/2023)   PRAPARE - Administrator, Civil Service (Medical): No    Lack of Transportation (Non-Medical): No  Physical Activity: Insufficiently Active (01/29/2023)   Exercise Vital Sign    Days of Exercise per Week: 3 days    Minutes of Exercise per Session: 40 min  Stress: No Stress Concern Present (01/29/2023)   [employer] of Occupational Health - Occupational Stress Questionnaire    Feeling of Stress : Only a little  Social Connections: Moderately Isolated (01/29/2023)   Social Connection and Isolation Panel [NHANES]    Frequency of Communication with Friends and Family: Twice a week    Frequency of Social Gatherings with Friends and Family: Three times a week    Attends Religious Services: Never    Active Member of Clubs or Organizations: Yes    Attends Banker Meetings: More than 4 times per year    Marital Status: Widowed  Intimate Partner Violence: Not At Risk (11/07/2022)   Humiliation, Afraid, Rape, and Kick questionnaire    Fear of Current or Ex-Partner: No    Emotionally Abused: No  Physically Abused: No    Sexually Abused: No   Past Surgical History:  Procedure Laterality Date   CATARACT EXTRACTION, BILATERAL     Family History  Problem Relation Age of Onset   Cancer Mother        Brain and lung    Hypertension Mother    Heart disease Father    Other Brother        died in war    Allergies Daughter    Menstrual problems Daughter    Breast cancer Daughter    Allergies Son    Heart disease Maternal Grandmother    Leukemia Maternal Grandfather    Dementia Paternal Grandmother    Other  Paternal Grandfather        diptheria    Mental illness Brother    Obesity Brother    Allergies Son     Current Outpatient Medications:    b complex vitamins capsule, Take 1 capsule by mouth daily., Disp: , Rfl:    Calcium 600-200 MG-UNIT tablet, Take 1 tablet by mouth daily., Disp: , Rfl:    cholecalciferol (VITAMIN D) 1000 units tablet, Take 1,000 Units by mouth daily., Disp: , Rfl:    LIDO-MENTHOL-METHYL SAL-CAMPH EX, Apply topically., Disp: , Rfl:    multivitamin-iron-minerals-folic acid (CENTRUM) chewable tablet, Chew 1 tablet by mouth daily., Disp: , Rfl:    oxybutynin (DITROPAN-XL) 5 MG 24 hr tablet, Take 1 tablet (5 mg total) by mouth at bedtime., Disp: 100 tablet, Rfl: 3   XIIDRA 5 % SOLN, Place 1 drop into both eyes 2 (two) times daily. , Disp: , Rfl:    alendronate (FOSAMAX) 70 MG tablet, Take 1 tablet (70 mg total) by mouth once a week. Take with a full glass of water on an empty stomach., Disp: 12 tablet, Rfl: 3   lisinopril (ZESTRIL) 40 MG tablet, Take 1 tablet (40 mg total) by mouth daily. STOP amlodipine. *New dose, Disp: 100 tablet, Rfl: 3   pravastatin (PRAVACHOL) 40 MG tablet, Take 1 tablet (40 mg total) by mouth daily., Disp: 100 tablet, Rfl: 3  No Known Allergies   ROS: Review of Systems A comprehensive review of systems was negative except for: Genitourinary: positive for urinary incontinence    Physical exam BP (!) 146/88   Pulse 76   Temp 98.4 F (36.9 C)   Ht 5\' 1"  (1.549 m)   Wt 125 lb (56.7 kg)   SpO2 96%   BMI 23.62 kg/m  General appearance: alert, cooperative, appears stated age, and no distress Head: Normocephalic, without obvious abnormality, atraumatic Eyes: negative findings: lids and lashes normal, conjunctivae and sclerae normal, corneas clear, and pupils equal, round, reactive to light and accomodation Ears:  Right external auditory canal with hemostatic bleed.  Left TM intact with normal reflex Nose: Nares normal. Septum midline. Mucosa  normal. No drainage or sinus tenderness. Throat: lips, mucosa, and tongue normal; teeth and gums normal Neck: no adenopathy, supple, symmetrical, trachea midline, and thyroid not enlarged, symmetric, no tenderness/mass/nodules Back: symmetric, no curvature. ROM normal. No CVA tenderness. Lungs: clear to auscultation bilaterally Heart: regular rate and rhythm, S1, S2 normal, no murmur, click, rub or gallop Abdomen: soft, non-tender; bowel sounds normal; no masses,  no organomegaly Extremities: extremities normal, atraumatic, no cyanosis or edema Pulses: 2+ and symmetric Skin:  Multiple senile purpura noted along bilateral upper extremities Lymph nodes: Cervical, supraclavicular, and axillary nodes normal. Neurologic: Grossly normal      02/28/2023    9:04 AM 02/22/2023  10:41 AM 11/07/2022   12:11 PM  Depression screen PHQ 2/9  Decreased Interest 0 0 0  Down, Depressed, Hopeless 0 0 0  PHQ - 2 Score 0 0 0  Altered sleeping 0 0   Tired, decreased energy 0 0   Change in appetite 0 0   Feeling bad or failure about yourself  0 0   Trouble concentrating 0 0   Moving slowly or fidgety/restless 0 0   Suicidal thoughts 0 0   PHQ-9 Score 0 0   Difficult doing work/chores Not difficult at all        02/28/2023    9:04 AM 04/20/2022    3:57 PM 02/19/2022   11:51 AM 02/17/2021    2:40 PM  GAD 7 : Generalized Anxiety Score  Nervous, Anxious, on Edge 0 0 0 1  Control/stop worrying 0 0 0 0  Worry too much - different things 0 0 0 0  Trouble relaxing 0 0 0 0  Restless 0 0 0 0  Easily annoyed or irritable 0 0 0 0  Afraid - awful might happen 0 0 0 0  Total GAD 7 Score 0 0 0 1  Anxiety Difficulty Not difficult at all Not difficult at all Not difficult at all Not difficult at all     Assessment/ Plan: [name] here for annual physical exam.   Annual physical exam  Essential hypertension - Plan: CMP14+EGFR, lisinopril (ZESTRIL) 40 MG tablet  Age-related osteoporosis without  current pathological fracture - Plan: CMP14+EGFR, VITAMIN D 25 Hydroxy (Vit-D Deficiency, Fractures), alendronate (FOSAMAX) 70 MG tablet, DG [facility] DEXA  Pure hypercholesterolemia - Plan: CMP14+EGFR, Lipid Panel, TSH, pravastatin (PRAVACHOL) 40 MG tablet  Screening, anemia, deficiency, iron - Plan: CBC  Mixed incontinence urge and stress - Plan: oxybutynin (DITROPAN-XL) 5 MG 24 hr tablet  Encounter for immunization - Plan: Flu Vaccine Trivalent High Dose (Fluad)  Purpura senilis (HCC)  Influenza vaccination administered.  DEXA preordered for June.  Blood pressure was controlled upon recheck.  Continue lisinopril.  Check renal function  Check vitamin D, calcium level.  Continue Fosamax for osteoporosis  Nonfasting lipid panel collected.  Continue Pravachol, check liver enzymes  Screening CBC  Ditropan ordered for incontinence.  Senile purpura noted along bilateral forearms.  This is likely due to thin skin  Counseled on healthy lifestyle choices, including diet (rich in fruits, vegetables and lean meats and low in salt and simple carbohydrates) and exercise (at least 30 minutes of moderate physical activity daily).  Patient to follow up 1 year for CPE  Lenox Ladouceur M. Nadine Counts, DO

## 2023-02-28 NOTE — Patient Instructions (Addendum)
Dexa due 10/2023.

## 2023-03-01 LAB — CBC
Hematocrit: 43.3 % (ref 34.0–46.6)
Hemoglobin: 14.6 g/dL (ref 11.1–15.9)
MCH: 31.7 pg (ref 26.6–33.0)
MCHC: 33.7 g/dL (ref 31.5–35.7)
MCV: 94 fL (ref 79–97)
Platelets: 185 10*3/uL (ref 150–450)
RBC: 4.6 x10E6/uL (ref 3.77–5.28)
RDW: 12.8 % (ref 11.7–15.4)
WBC: 4.9 10*3/uL (ref 3.4–10.8)

## 2023-03-01 LAB — CMP14+EGFR
ALT: 7 [IU]/L (ref 0–32)
AST: 26 [IU]/L (ref 0–40)
Albumin: 4.3 g/dL (ref 3.8–4.8)
Alkaline Phosphatase: 68 [IU]/L (ref 44–121)
BUN/Creatinine Ratio: 15 (ref 12–28)
BUN: 11 mg/dL (ref 8–27)
Bilirubin Total: 0.5 mg/dL (ref 0.0–1.2)
CO2: 23 mmol/L (ref 20–29)
Calcium: 9.5 mg/dL (ref 8.7–10.3)
Chloride: 102 mmol/L (ref 96–106)
Creatinine, Ser: 0.71 mg/dL (ref 0.57–1.00)
Globulin, Total: 2.3 g/dL (ref 1.5–4.5)
Glucose: 90 mg/dL (ref 70–99)
Potassium: 4.6 mmol/L (ref 3.5–5.2)
Sodium: 139 mmol/L (ref 134–144)
Total Protein: 6.6 g/dL (ref 6.0–8.5)
eGFR: 86 mL/min/{1.73_m2} (ref >59)

## 2023-03-01 LAB — LIPID PANEL
Chol/HDL Ratio: 2.8 {ratio} (ref 0.0–4.4)
Cholesterol, Total: 168 mg/dL (ref 100–199)
HDL: 59 mg/dL (ref >39)
LDL Chol Calc (NIH): 93 mg/dL (ref 0–99)
Triglycerides: 87 mg/dL (ref 0–149)
VLDL Cholesterol Cal: 16 mg/dL (ref 5–40)

## 2023-03-01 LAB — VITAMIN D 25 HYDROXY (VIT D DEFICIENCY, FRACTURES): Vit D, 25-Hydroxy: 73.8 ng/mL (ref 30.0–100.0)

## 2023-03-01 LAB — TSH: TSH: 4.01 u[IU]/mL (ref 0.450–4.500)

## 2023-04-15 IMAGING — MG MM DIGITAL SCREENING BILAT W/ TOMO AND CAD
8 series · 9 of 24 positions shown · non-contrast
Comparison: Previous exam(s).

CLINICAL DATA: Screening.

EXAM:
DIGITAL SCREENING BILATERAL MAMMOGRAM WITH TOMOSYNTHESIS AND CAD
TECHNIQUE: Bilateral screening digital craniocaudal and mediolateral oblique
mammograms were obtained. Bilateral screening digital breast
tomosynthesis was performed. The images were evaluated with
computer-aided detection.

[L CC synth-2D]
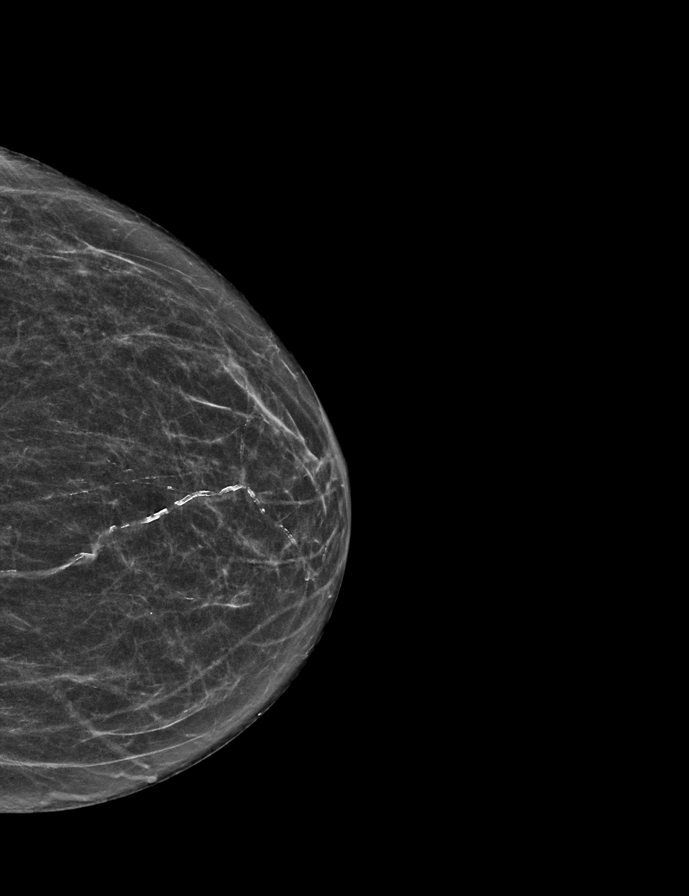

[R CC synth-2D]
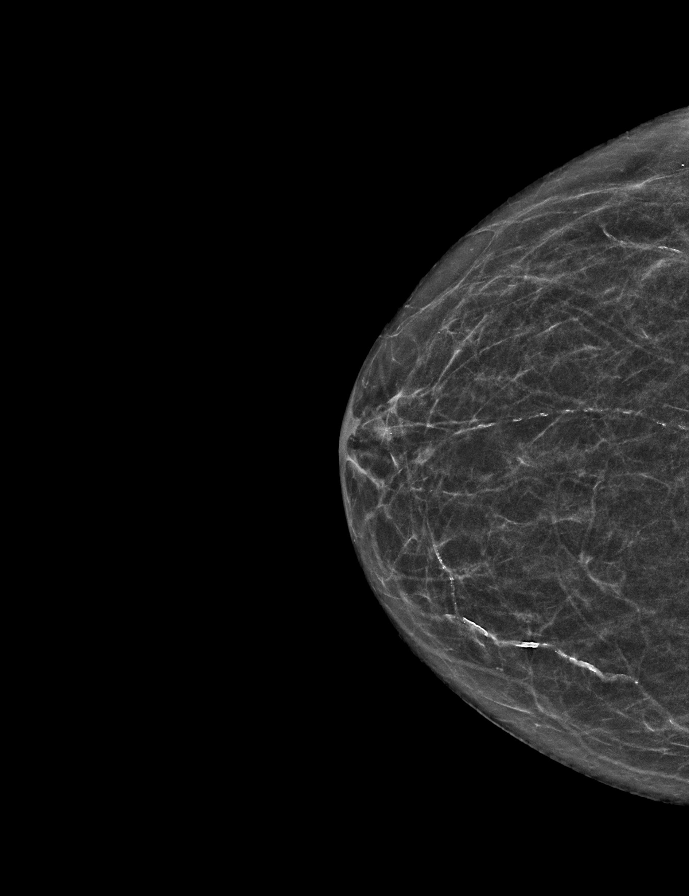

[R MLO synth-2D]
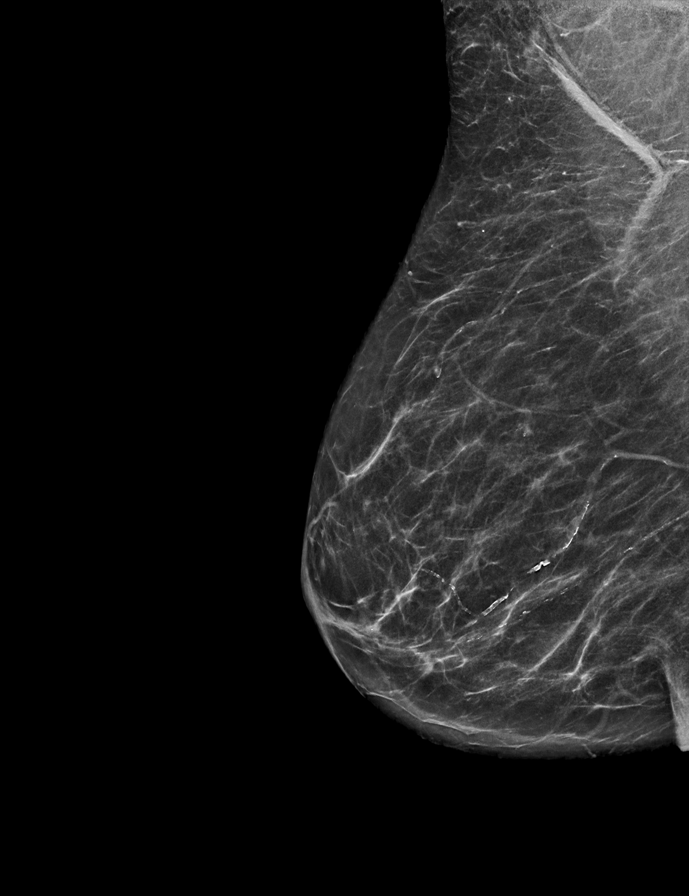

[L MLO synth-2D]
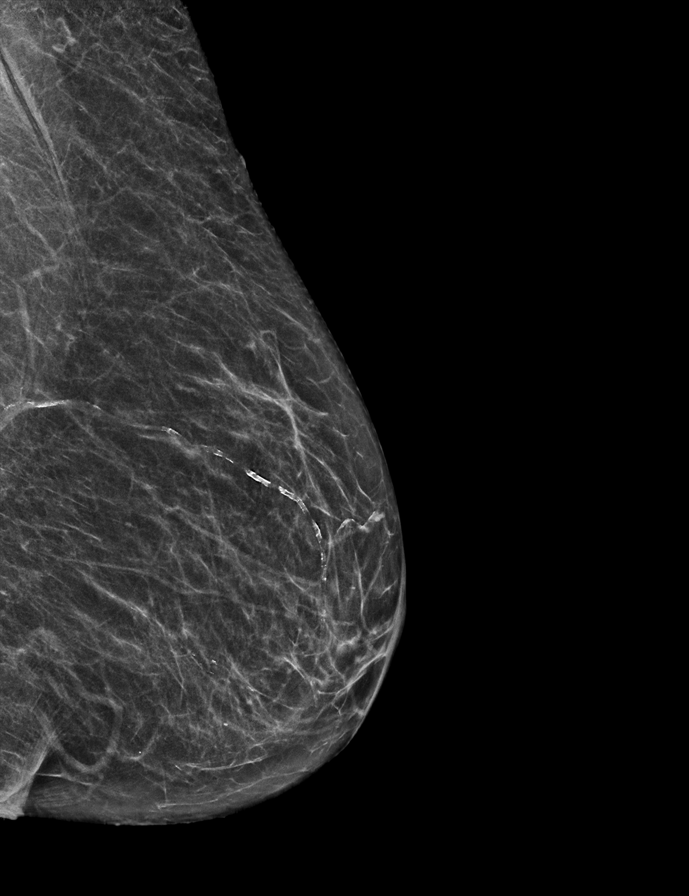

[L CC tomo · 2 of 47 frames shown]
[frame 16/47]
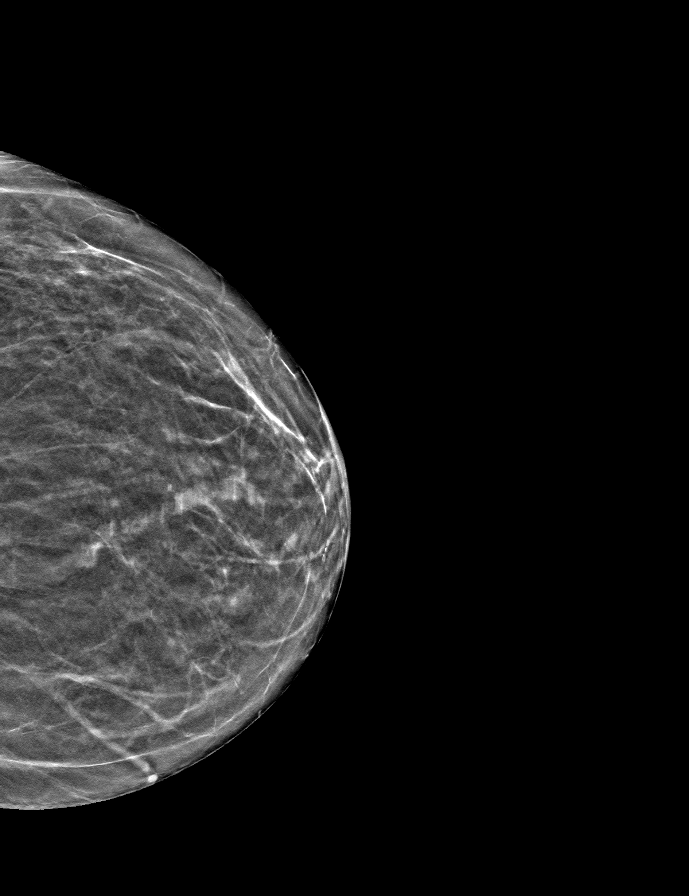
[frame 24/47]
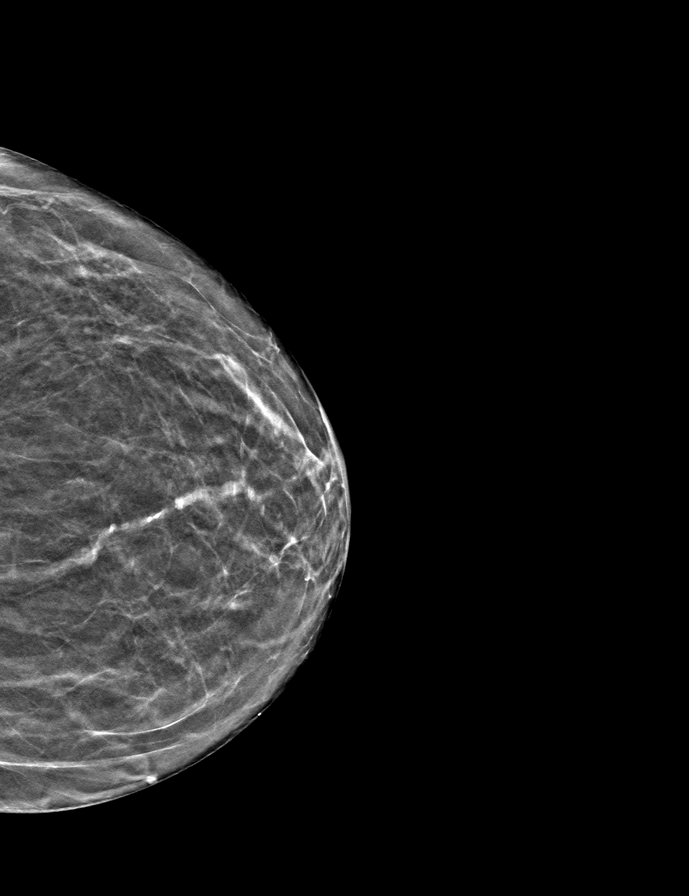

[L MLO tomo · tomo slice 28/55.0]
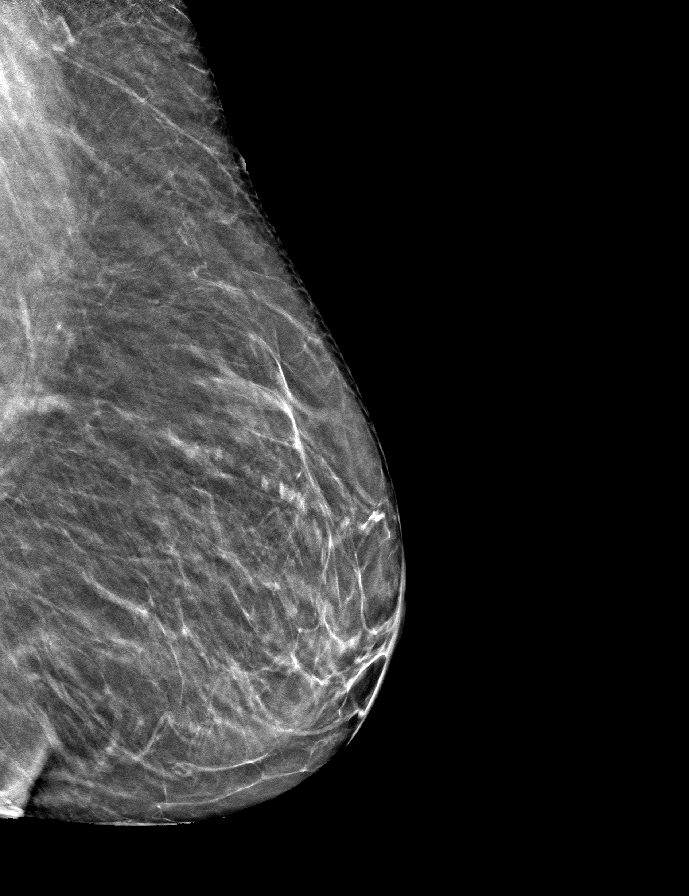

[R MLO tomo · tomo slice 29/57.0]
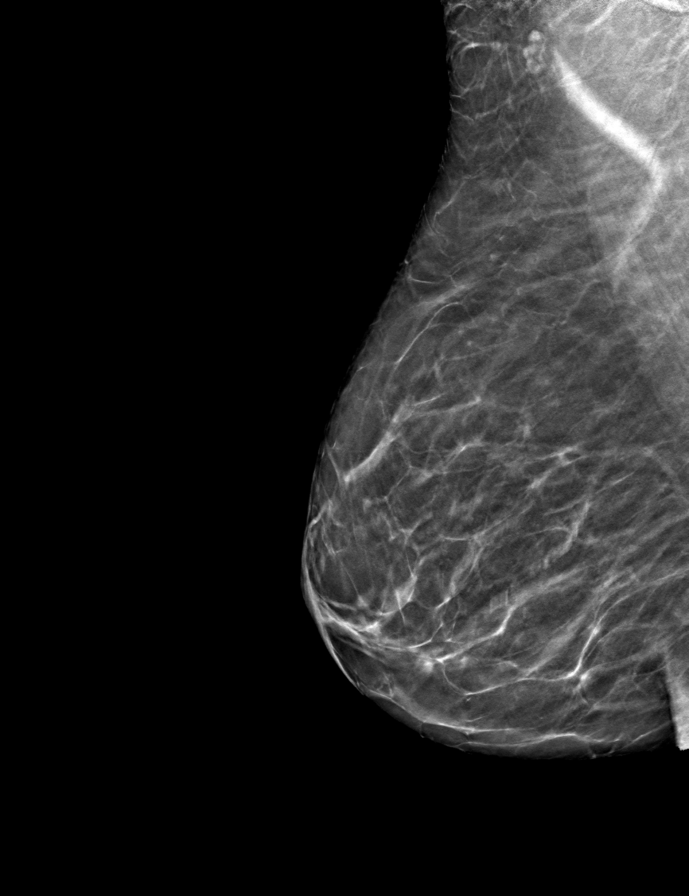

[R CC tomo · tomo slice 24/47.0]
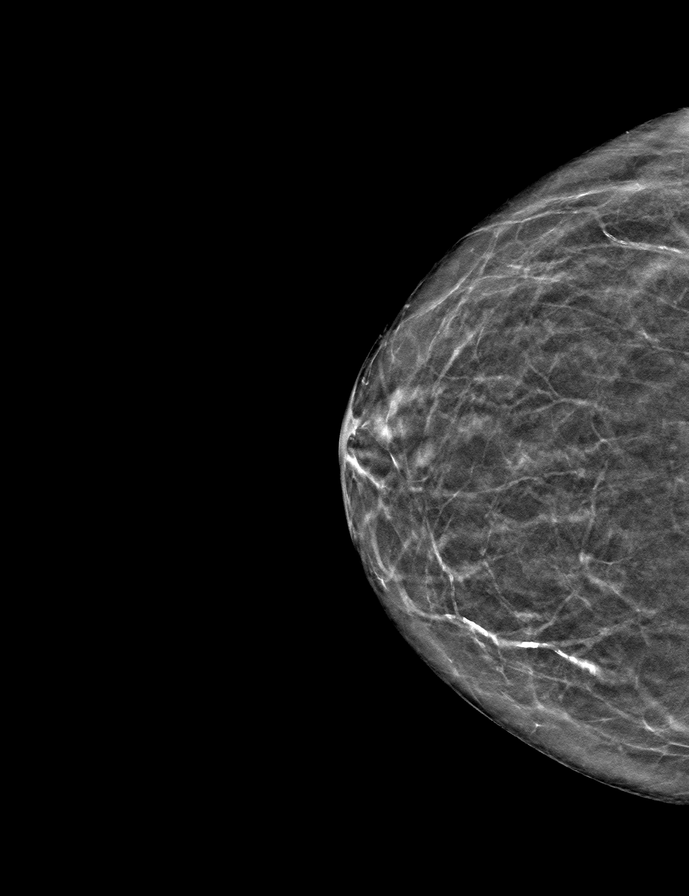

[9 of 24 positions shown; findings below may reference images not displayed]

ACR Breast Density Category b: There are scattered areas of
fibroglandular density.
FINDINGS: There are no findings suspicious for malignancy.
IMPRESSION: No mammographic evidence of malignancy. A result letter of this
screening mammogram will be mailed directly to the patient.

RECOMMENDATION:
Screening mammogram in one year. (Code:51-O-LD2)

BI-RADS CATEGORY  1: Negative.

## 2023-04-29 ENCOUNTER — Ambulatory Visit (INDEPENDENT_AMBULATORY_CARE_PROVIDER_SITE_OTHER): Payer: Medicare Other | Admitting: Nurse Practitioner

## 2023-04-29 VITALS — BP 156/80 | HR 77 | Temp 97.5°F | Ht 61.0 in | Wt 126.4 lb

## 2023-04-29 DIAGNOSIS — I10 Essential (primary) hypertension: Secondary | ICD-10-CM

## 2023-04-29 DIAGNOSIS — H7291 Unspecified perforation of tympanic membrane, right ear: Secondary | ICD-10-CM | POA: Diagnosis not present

## 2023-04-29 DIAGNOSIS — H66004 Acute suppurative otitis media without spontaneous rupture of ear drum, recurrent, right ear: Secondary | ICD-10-CM

## 2023-04-29 MED ORDER — CEFDINIR 300 MG PO CAPS
300.0000 mg | ORAL_CAPSULE | Freq: Two times a day (BID) | ORAL | 0 refills | Status: DC
Start: 2023-04-29 — End: 2023-11-08

## 2023-04-29 NOTE — Progress Notes (Signed)
Acute Office Visit  Subjective:     Patient ID: Ann Bolton, female    DOB: 1946/03/18, 77 y.o.   MRN: [id number]  Chief Complaint  Patient presents with   Ear Pain    Right ear pain since September got better but never went away    HPI Ann Bolton is a 77 year old female present April 29, 2023 with an acute visit concern for right neck ear infection infection. She was seen 10/14 2024 for ear pain and advised to take OTC Flonase/Zyrtec or Xyzal. She was also seen on 01/22/2023 and treated with Amoxicillin for otitis media, which she reports was never fully resolved. Today she c/o of right ear pain and feeling of Ear fullness for weeks now.   HTN BP is elevated today 156/80 and has been  for her last two visit 02/28/23 146/88 and 02/22/23 148/84. HTN is currently being managed with Lisinopril 40 mg daily. Denies any HA, change in vision or blurry vision. She reports that she has not take her hypertensive medication today and will do so when she get s home. BP log provided to her and instructed to get BP BID and to bring log to her next appointment with her PCP.  Active Ambulatory Problems    Diagnosis Date Noted   Essential hypertension 01/23/2017   Pure hypercholesterolemia 01/23/2017   Osteoporosis 06/22/2019   Recurrent acute suppurative otitis media of right ear without spontaneous rupture of tympanic membrane 04/29/2023   Perforation of right tympanic membrane 04/29/2023   Resolved Ambulatory Problems    Diagnosis Date Noted   UTI (urinary tract infection) 04/08/2021   Past Medical History:  Diagnosis Date   Allergy    DDD (degenerative disc disease), lumbar    Hyperlipidemia    Hypertension     Review of Systems  Constitutional:  Negative for chills and fever.  HENT:  Positive for ear pain. Negative for hearing loss and tinnitus.        Right  Eyes:  Negative for pain and discharge.  Respiratory:  Negative for cough and shortness of breath.   Cardiovascular:   Negative for chest pain, palpitations and leg swelling.  Gastrointestinal:  Negative for nausea and vomiting.  Musculoskeletal:  Negative for joint pain and myalgias.  Skin:  Negative for itching and rash.  Neurological:  Negative for dizziness, tingling, speech change and headaches.  Endo/Heme/Allergies:  Negative for environmental allergies and polydipsia. Does not bruise/bleed easily.  Psychiatric/Behavioral:  Negative for suicidal ideas. The patient does not have insomnia.    Negative unless indicated in HPI    Objective:    BP (!) 156/80   Pulse 77   Temp (!) 97.5 F (36.4 C) (Temporal)   Ht 5\' 1"  (1.549 m)   Wt 126 lb 6.4 oz (57.3 kg)   SpO2 100%   BMI 23.88 kg/m  BP Readings from Last 3 Encounters:  04/29/23 (!) 156/80  02/28/23 (!) 146/88  02/22/23 (!) 148/84   Wt Readings from Last 3 Encounters:  04/29/23 126 lb 6.4 oz (57.3 kg)  02/28/23 125 lb (56.7 kg)  02/22/23 126 lb (57.2 kg)      Physical Exam Vitals and nursing note reviewed.  Constitutional:      Appearance: Normal appearance.  HENT:     Head: Normocephalic and atraumatic.     Right Ear: Tenderness present. Tympanic membrane is perforated and erythematous.     Left Ear: Hearing, tympanic membrane and ear canal normal.  Mouth/Throat:     Lips: Pink.     Mouth: Mucous membranes are moist.  Eyes:     General: No scleral icterus.    Extraocular Movements: Extraocular movements intact.     Conjunctiva/sclera: Conjunctivae normal.     Pupils: Pupils are equal, round, and reactive to light.  Cardiovascular:     Rate and Rhythm: Normal rate and regular rhythm.  Pulmonary:     Effort: Pulmonary effort is normal.     Breath sounds: Normal breath sounds.  Musculoskeletal:        General: Normal range of motion.     Right lower leg: No edema.     Left lower leg: No edema.  Skin:    General: Skin is warm and dry.     Findings: No rash.  Neurological:     Mental Status: She is alert and oriented to  person, place, and time. Mental status is at baseline.  Psychiatric:        Mood and Affect: Mood normal.        Behavior: Behavior normal.        Thought Content: Thought content normal.        Judgment: Judgment normal.     No results found for any visits on 04/29/23.      Assessment & Plan:  Recurrent acute suppurative otitis media of right ear without spontaneous rupture of tympanic membrane -     Cefdinir; Take 1 capsule (300 mg total) by mouth 2 (two) times daily.  Dispense: 10 capsule; Refill: 0  Perforation of right tympanic membrane -     Cefdinir; Take 1 capsule (300 mg total) by mouth 2 (two) times daily.  Dispense: 10 capsule; Refill: 0  Essential hypertension  Ann Bolton is a 77 year old Caucasian female seen today for otitis media, no acute distress Otitis media: Cefdinir 300 mg twice daily for 10 days HTN: No medication changes.  Client is instructed to take her medication as prescribed and call, and to follow-up low-sodium diet She is instructed to call the clinic with any concerns  Return for Follow-up with PCP as already scheduled.  Arrie Aran Santa Lighter, [facility] [address] [place], [place] [address] [phone]  Note: This document was prepared by [employer] voice dictation technology and any errors that results from this process are unintentional.

## 2023-06-07 ENCOUNTER — Other Ambulatory Visit: Payer: Self-pay | Admitting: Family Medicine

## 2023-06-07 DIAGNOSIS — Z1231 Encounter for screening mammogram for malignant neoplasm of breast: Secondary | ICD-10-CM

## 2023-06-12 ENCOUNTER — Ambulatory Visit
Admission: RE | Admit: 2023-06-12 | Discharge: 2023-06-12 | Disposition: A | Discharge status: Home / Self Care | Payer: Medicare Other | Source: Ambulatory Visit | Attending: Family Medicine | Admitting: Family Medicine

## 2023-06-12 DIAGNOSIS — Z1231 Encounter for screening mammogram for malignant neoplasm of breast: Secondary | ICD-10-CM | POA: Diagnosis not present

## 2023-11-08 ENCOUNTER — Ambulatory Visit: Payer: Medicare Other

## 2023-11-08 VITALS — Ht 61.0 in | Wt 125.0 lb

## 2023-11-08 DIAGNOSIS — Z0000 Encounter for general adult medical examination without abnormal findings: Secondary | ICD-10-CM | POA: Diagnosis not present

## 2023-11-08 NOTE — Patient Instructions (Signed)
 Ann Bolton , Thank you for taking time out of your busy schedule to complete your Annual Wellness Visit with me. I enjoyed our conversation and look forward to speaking with you again next year. I, as well as your care team,  appreciate your ongoing commitment to your health goals. Please review the following plan we discussed and let me know if I can assist you in the future. Your Game plan/ To Do List    Follow up Visits: Next Medicare AWV with our clinical staff: November 09, 2024 at 3:10 pm telephone visit   Have you seen your provider in the last 6 months (3 months if uncontrolled diabetes)? Yes Next Office Visit with your provider: November 15, 2023 at 3:05pm in office to discuss jaw pain Routine Visit with Ashly: February 28, 2024 at 10:30 am in office  Clinician Recommendations:  Aim for 30 minutes of exercise or brisk walking, 6-8 glasses of water, and 5 servings of fruits and vegetables each day.       This is a list of the screening recommended for you and due dates:  Health Maintenance  Topic Date Due   COVID-19 Vaccine (7 - 2024-25 season) 05/01/2023   DEXA scan (bone density measurement)  11/09/2023   Flu Shot  12/20/2023   Mammogram  06/11/2024   Medicare Annual Wellness Visit  11/07/2024   DTaP/Tdap/Td vaccine (2 - Td or Tdap) 02/16/2029   Pneumococcal Vaccine for age over 41  Completed   Zoster (Shingles) Vaccine  Completed   HPV Vaccine  Aged Out   Meningitis B Vaccine  Aged Out   Hepatitis C Screening  Discontinued    Advanced directives: (Provided) Advance directive discussed with you today. I have provided a copy for you to complete at home and have notarized. Once this is complete, please bring a copy in to our office so we can scan it into your chart.  Advance Care Planning is important because it:  [x]  Makes sure you receive the medical care that is consistent with your values, goals, and preferences  [x]  It provides guidance to your family and loved ones and reduces  their decisional burden about whether or not they are making the right decisions based on your wishes.  Follow the link provided in your after visit summary or read over the paperwork we have mailed to you to help you started getting your Advance Directives in place. If you need assistance in completing these, please reach out to us  so that we can help you!  Understanding Your Risk for Falls Millions of people have serious injuries from falls each year. It is important to understand your risk of falling. Talk with your health care provider about your risk and what you can do to lower it. If you do have a serious fall, make sure to tell your provider. Falling once raises your risk of falling again. How can falls affect me? Serious injuries from falls are common. These include: Broken bones, such as hip fractures. Head injuries, such as traumatic brain injuries (TBI) or concussions. A fear of falling can cause you to avoid activities and stay at home. This can make your muscles weaker and raise your risk for a fall. What can increase my risk? There are a number of risk factors that increase your risk for falling. The more risk factors you have, the higher your risk of falling. Serious injuries from a fall happen most often to people who are older than 80 years old. Teenagers  and young adults ages 36-29 are also at higher risk. Common risk factors include: Weakness in the lower body. Being generally weak or confused due to long-term (chronic) illness. Dizziness or balance problems. Poor vision. Medicines that cause dizziness or drowsiness. These may include: Medicines for your blood pressure, heart, anxiety, insomnia, or swelling (edema). Pain medicines. Muscle relaxants. Other risk factors include: Drinking alcohol. Having had a fall in the past. Having foot pain or wearing improper footwear. Working at a dangerous job. Having any of the following in your home: Tripping hazards, such as  floor clutter or loose rugs. Poor lighting. Pets. Having dementia or memory loss. What actions can I take to lower my risk of falling?     Physical activity Stay physically fit. Do strength and balance exercises. Consider taking a regular class to build strength and balance. Yoga and tai chi are good options. Vision Have your eyes checked every year and your prescription for glasses or contacts updated as needed. Shoes and walking aids Wear non-skid shoes. Wear shoes that have rubber soles and low heels. Do not wear high heels. Do not walk around the house in socks or slippers. Use a cane or walker as told by your provider. Home safety Attach secure railings on both sides of your stairs. Install grab bars for your bathtub, shower, and toilet. Use a non-skid mat in your bathtub or shower. Attach bath mats securely with double-sided, non-slip rug tape. Use good lighting in all rooms. Keep a flashlight near your bed. Make sure there is a clear path from your bed to the bathroom. Use night-lights. Do not use throw rugs. Make sure all carpeting is taped or tacked down securely. Remove all clutter from walkways and stairways, including extension cords. Repair uneven or broken steps and floors. Avoid walking on icy or slippery surfaces. Walk on the grass instead of on icy or slick sidewalks. Use ice melter to get rid of ice on walkways in the winter. Use a cordless phone. Questions to ask your health care provider Can you help me check my risk for a fall? Do any of my medicines make me more likely to fall? Should I take a vitamin D  supplement? What exercises can I do to improve my strength and balance? Should I make an appointment to have my vision checked? Do I need a bone density test to check for weak bones (osteoporosis)? Would it help to use a cane or a walker? Where to find more information Centers for Disease Control and Prevention, STEAD[employer]: TonerPromos.no Community-Based Fall  Prevention Programs: TonerPromos.no [employer] on Aging: BaseRingTones.pl Contact a health care provider if: You fall at home. You are afraid of falling at home. You feel weak, drowsy, or dizzy. This information is not intended to replace advice given to you by your health care provider. Make sure you discuss any questions you have with your health care provider. Document Revised: 01/08/2022 Document Reviewed: 01/08/2022 Elsevier Patient Education  2024 [employer].

## 2023-11-08 NOTE — Progress Notes (Signed)
 Subjective:   Ann Bolton is a 78 y.o. who presents for a Medicare Wellness preventive visit.  As a reminder, Annual Wellness Visits don't include a physical exam, and some assessments may be limited, especially if this visit is performed virtually. We may recommend an in-person follow-up visit with your provider if needed.  Visit Complete: Virtual I connected with  Ann Bolton on 11/08/23 by a audio enabled telemedicine application and verified that I am speaking with the correct person using two identifiers.  Patient Location: Home  Provider Location: Home Office  I discussed the limitations of evaluation and management by telemedicine. The patient expressed understanding and agreed to proceed.  Vital Signs: Because this visit was a virtual/telehealth visit, some criteria may be missing or patient reported. Any vitals not documented were not able to be obtained and vitals that have been documented are patient reported.  VideoDeclined- This patient declined Librarian, academic. Therefore the visit was completed with audio only.  Persons Participating in Visit: Patient.  AWV Questionnaire: Yes: Patient Medicare AWV questionnaire was completed by the patient on 11/05/2023; I have confirmed that all information answered by patient is correct and no changes since this date.  Cardiac Risk Factors include: advanced age (>67men, >76 women);dyslipidemia;hypertension     Objective:    Today's Vitals   11/08/23 0959  Weight: 125 lb (56.7 kg)  Height: 5' 1 (1.549 m)   Body mass index is 23.62 kg/m.     11/08/2023   10:04 AM 11/07/2022   12:12 PM 12/04/2021    9:54 AM 11/03/2021   11:28 AM 11/02/2019    9:43 AM 10/17/2018    2:40 PM  Advanced Directives  Does Patient Have a Medical Advance Directive? Yes Yes Yes Yes Yes Yes  Type of Estate agent of [place];Living will Healthcare Power of [place];Living will  Healthcare Power of  [place];Living will Healthcare Power of [place];Living will;Out of facility DNR (pink MOST or yellow form) Healthcare Power of [place];Living will  Does patient want to make changes to medical advance directive? No - Patient declined No - Patient declined   No - Patient declined No - Patient declined   Copy of Healthcare Power of Attorney in Chart? Yes - validated most recent copy scanned in chart (See row information) Yes - validated most recent copy scanned in chart (See row information)  Yes - validated most recent copy scanned in chart (See row information) Yes - validated most recent copy scanned in chart (See row information) No - copy requested      Data saved with a previous flowsheet row definition    Current Medications (verified) Outpatient Encounter Medications as of 11/08/2023  Medication Sig   alendronate  (FOSAMAX ) 70 MG tablet Take 1 tablet (70 mg total) by mouth once a week. Take with a full glass of water on an empty stomach.   b complex vitamins capsule Take 1 capsule by mouth daily.   Calcium 600-200 MG-UNIT tablet Take 1 tablet by mouth daily.   cholecalciferol (VITAMIN D ) 1000 units tablet Take 1,000 Units by mouth daily.   LIDO-MENTHOL-METHYL SAL-CAMPH EX Apply topically.   lisinopril  (ZESTRIL ) 40 MG tablet Take 1 tablet (40 mg total) by mouth daily. STOP amlodipine . *New dose   multivitamin-iron-minerals-folic acid (CENTRUM) chewable tablet Chew 1 tablet by mouth daily.   oxybutynin  (DITROPAN -XL) 5 MG 24 hr tablet Take 1 tablet (5 mg total) by mouth at bedtime.   pravastatin  (PRAVACHOL ) 40 MG tablet  Take 1 tablet (40 mg total) by mouth daily.   XIIDRA 5 % SOLN Place 1 drop into both eyes 2 (two) times daily.    [DISCONTINUED] cefdinir  (OMNICEF ) 300 MG capsule Take 1 capsule (300 mg total) by mouth 2 (two) times daily.   No facility-administered encounter medications on file as of 11/08/2023.    Allergies (verified) Patient has no known allergies.   History: Past  Medical History:  Diagnosis Date   Allergy    Arthritis    DDD (degenerative disc disease), lumbar    Hyperlipidemia    Hypertension    Osteoporosis    Past Surgical History:  Procedure Laterality Date   CATARACT EXTRACTION, BILATERAL     Family History  Problem Relation Age of Onset   Cancer Mother        Brain and lung    Hypertension Mother    Heart disease Father    Other Brother        died in war    Allergies Daughter    Menstrual problems Daughter    Breast cancer Daughter    Cancer Daughter    Allergies Son    Heart disease Maternal Grandmother    Leukemia Maternal Grandfather    Dementia Paternal Grandmother    Other Paternal Grandfather        diptheria    Mental illness Brother    Obesity Brother    Allergies Son    Social History   Socioeconomic History   Marital status: Widowed    Spouse name: Not on file   Number of children: 3   Years of education: Not on file   Highest education level: Bachelor's degree (e.g., BA, AB, BS)  Occupational History   Occupation: retired    Comment: banking   Tobacco Use   Smoking status: Never   Smokeless tobacco: Never  Vaping Use   Vaping status: Never Used  Substance and Sexual Activity   Alcohol use: Yes    Alcohol/week: 2.0 standard drinks of alcohol    Types: 2 Glasses of wine per week    Comment: Red wine   Drug use: No   Sexual activity: Not Currently  Other Topics Concern   Not on file  Social History Narrative   She lives alone - one level. 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   Social Drivers of Corporate investment banker Strain: Low Risk  (11/05/2023)   Overall Financial Resource Strain (CARDIA)    Difficulty of Paying Living Expenses: Not hard at all  Food Insecurity: No Food Insecurity (11/05/2023)   Hunger Vital Sign    Worried About Running Out of Food in the Last  Year: Never true    Ran Out of Food in the Last Year: Never true  Transportation Needs: No Transportation Needs (11/05/2023)   PRAPARE - Administrator, Civil Service (Medical): No    Lack of Transportation (Non-Medical): No  Physical Activity: Sufficiently Active (11/05/2023)   Exercise Vital Sign    Days of Exercise per Week: 4 days    Minutes of Exercise per Session: 40 min  Stress: No Stress Concern Present (11/05/2023)   [employer] of Occupational Health - Occupational Stress Questionnaire    Feeling of Stress: Only a little  Social Connections: Moderately Isolated (11/05/2023)   Social Connection and Isolation Panel    Frequency  of Communication with Friends and Family: More than three times a week    Frequency of Social Gatherings with Friends and Family: Three times a week    Attends Religious Services: Never    Active Member of Clubs or Organizations: Yes    Attends Banker Meetings: More than 4 times per year    Marital Status: Widowed    Tobacco Counseling Counseling given: Yes    Clinical Intake:  Pre-visit preparation completed: Yes  Pain : No/denies pain     BMI - recorded: 23.62 Nutritional Status: BMI of 19-24  Normal Nutritional Risks: None Diabetes: No  No results found for: HGBA1C   How often do you need to have someone help you when you read instructions, pamphlets, or other written materials from your doctor or pharmacy?: 1 - Never  Interpreter Needed?: No  Information entered by :: Sally Crazier CMA   Activities of Daily Living     11/05/2023   11:21 AM  In your present state of health, do you have any difficulty performing the following activities:  Hearing? 0  Vision? 0  Difficulty concentrating or making decisions? 0  Walking or climbing stairs? 0  Dressing or bathing? 0  Doing errands, shopping? 0  Preparing Food and eating ? N  Using the Toilet? N  In the past six months, have you accidently leaked urine? N   Do you have problems with loss of bowel control? N  Managing your Medications? N  Managing your Finances? N  Housekeeping or managing your Housekeeping? N    Patient Care Team: Eliodoro Guerin, DO as PCP - General (Family Medicine) Ashok Blake, DPM as Consulting Physician (Podiatry) Buck Carbon, Princess Brooks, MD as Referring Physician (Optometry)  I have updated your Care Teams any recent Medical Services you may have received from other providers in the past year.     Assessment:   This is a routine wellness examination for [facility].  Hearing/Vision screen Hearing Screening - Comments:: Patient states there are some sounds she has trouble hearing. She declines testing at this time  Vision Screening - Comments:: Wears rx glasses - up to date with routine eye exams  Dr. Buck Carbon at [facility]   Discussed Advanced Directives: Patient has advanced directives on file. Last signed in 2006. Advised that it is recommended to update those every 10 years. Patient would like ACP packet. That packet provided to patient.   Goals Addressed               This Visit's Progress     Patient Stated (pt-stated)        To build my strength back up       Prevent falls   On track     Stay active        Depression Screen     11/08/2023   10:13 AM 02/28/2023    9:04 AM 02/22/2023   10:41 AM 11/07/2022   12:11 PM 04/20/2022    3:58 PM 02/19/2022   11:51 AM 02/19/2022   11:06 AM  PHQ 2/9 Scores  PHQ - 2 Score 0 0 0 0 0 0 0  PHQ- 9 Score 0 0 0        Fall Risk     11/05/2023   11:21 AM 02/28/2023    8:59 AM 02/22/2023   10:41 AM 11/07/2022   12:09 PM 11/03/2022   10:37 AM  Fall Risk   Falls in the past year? 0 0  0 0 0  Number falls in past yr: 0 0 0 0 0  Injury with Fall? 0 0 0 0 0  Risk for fall due to : No Fall Risks No Fall Risks No Fall Risks No Fall Risks   Follow up Falls evaluation completed;Education provided;Falls prevention discussed Education provided Falls evaluation completed  Falls prevention discussed     MEDICARE RISK AT HOME:  Medicare Risk at Home Any stairs in or around the home?: (Patient-Rptd) No If so, are there any without handrails?: No Home free of loose throw rugs in walkways, pet beds, electrical cords, etc?: (Patient-Rptd) Yes Adequate lighting in your home to reduce risk of falls?: (Patient-Rptd) Yes Life alert?: (Patient-Rptd) No Use of a cane, walker or w/c?: (Patient-Rptd) No Grab bars in the bathroom?: (Patient-Rptd) No Shower chair or bench in shower?: (Patient-Rptd) No Elevated toilet seat or a handicapped toilet?: (Patient-Rptd) Yes  TIMED UP AND GO:  Was the test performed?  No  Cognitive Function: 6CIT completed        11/08/2023   10:12 AM 11/07/2022   12:10 PM 11/03/2021   11:29 AM 11/02/2019    9:49 AM 10/17/2018    2:45 PM  6CIT Screen  What Year? 0 points 0 points 0 points 0 points 0 points  What month? 0 points 0 points 0 points 0 points 0 points  What time? 0 points 0 points 0 points 0 points 0 points  Count back from 20 0 points 0 points 0 points 0 points 0 points  Months in reverse 0 points 0 points 0 points 0 points 0 points  Repeat phrase 0 points 0 points 0 points 0 points 2 points  Total Score 0 points 0 points 0 points 0 points 2 points    Immunizations Immunization History  Administered Date(s) Administered   Fluad Quad(high Dose 65+) 01/24/2019, 02/17/2020, 02/17/2021, 02/19/2022   Fluad Trivalent(High Dose 65+) 02/28/2023   Influenza, High Dose Seasonal PF 03/09/2016, 02/25/2018   Influenza-Unspecified 02/25/2017, 01/27/2019   Moderna Covid-19 Vaccine Bivalent Booster 74yrs & up 05/30/2021   Moderna SARS-COV2 Booster Vaccination 03/16/2020   Moderna Sars-Covid-2 Vaccination 06/11/2019, 07/17/2019, 11/08/2020, 03/06/2023   Pneumococcal Conjugate-13 03/09/2016   Pneumococcal Polysaccharide-23 03/31/2018   Tdap 02/17/2019   Zoster Recombinant(Shingrix) 01/23/2017, 04/30/2017    Screening Tests Health  Maintenance  Topic Date Due   COVID-19 Vaccine (7 - 2024-25 season) 05/01/2023   DEXA SCAN  11/09/2023   INFLUENZA VACCINE  12/20/2023   MAMMOGRAM  06/11/2024   Medicare Annual Wellness (AWV)  11/07/2024   DTaP/Tdap/Td (2 - Td or Tdap) 02/16/2029   Pneumococcal Vaccine: 50+ Years  Completed   Zoster Vaccines- Shingrix  Completed   HPV VACCINES  Aged Out   Meningococcal B Vaccine  Aged Out   Hepatitis C Screening  Discontinued    Health Maintenance  Health Maintenance Due  Topic Date Due   COVID-19 Vaccine (7 - 2024-25 season) 05/01/2023   Health Maintenance Items Addressed: Up to date, osteoporosis screening scheduled Discussed recommended vaccines and where to have those completed.   Additional Screening:  Vision Screening: Recommended annual ophthalmology exams for early detection of glaucoma and other disorders of the eye. Would you like a referral to an eye doctor? No   Patient up to date with yearly exams.  Dental Screening: Recommended annual dental exams for proper oral hygiene  Community Resource Referral / Chronic Care Management: CRR required this visit?  No   CCM required this visit?  No  Plan: patient c/o jaw pain. She has been on Fosamax  since at least 2022. Has concerns about possible side effects of long term use of this medication. Scheduled an appointment with pcp for November 15, 2023 at 3:05 pm. Patient verbalizes understanding of treatment plan and is in agreement with the plan. They have been provided with a copy of their treatment plan in either a printed letter mailed to them by request or via mychart.       I have personally reviewed and noted the following in the patient's chart:   Medical and social history Use of alcohol, tobacco or illicit drugs  Current medications and supplements including opioid prescriptions. Patient is not currently taking opioid prescriptions. Functional ability and status Nutritional status Physical activity Advanced  directives List of other physicians Hospitalizations, surgeries, and ER visits in previous 12 months Vitals Screenings to include cognitive, depression, and falls Referrals and appointments  In addition, I have reviewed and discussed with patient certain preventive protocols, quality metrics, and best practice recommendations. A written personalized care plan for preventive services as well as general preventive health recommendations were provided to patient.   Alexina Niccoli, CMA   11/08/2023   After Visit Summary: (MyChart) Due to this being a telephonic visit, the after visit summary with patients personalized plan was offered to patient via MyChart   Notes: See note in PLAN section of this progress note

## 2023-11-15 ENCOUNTER — Encounter: Payer: Self-pay | Admitting: Family Medicine

## 2023-11-15 ENCOUNTER — Ambulatory Visit (INDEPENDENT_AMBULATORY_CARE_PROVIDER_SITE_OTHER): Admitting: Family Medicine

## 2023-11-15 VITALS — BP 130/79 | HR 77 | Temp 97.9°F | Ht 61.0 in | Wt 127.2 lb

## 2023-11-15 DIAGNOSIS — M810 Age-related osteoporosis without current pathological fracture: Secondary | ICD-10-CM

## 2023-11-15 DIAGNOSIS — M26623 Arthralgia of bilateral temporomandibular joint: Secondary | ICD-10-CM

## 2023-11-15 DIAGNOSIS — M67922 Unspecified disorder of synovium and tendon, left upper arm: Secondary | ICD-10-CM | POA: Diagnosis not present

## 2023-11-15 NOTE — Patient Instructions (Signed)
 Temporomandibular Joint Syndrome  Temporomandibular joint syndrome (TMJ syndrome) is a condition that causes pain in the temporomandibular joints. These joints are located near your ears and allow your jaw to open and close. For people with TMJ syndrome, chewing, biting, or other movements of the jaw can be difficult or painful. TMJ syndrome is often mild and goes away within a few weeks. However, sometimes the condition becomes a long-term (chronic) problem. What are the causes? This condition may be caused by: Grinding your teeth or clenching your jaw. Some people do this when they are stressed. Arthritis. An injury to the jaw. A head or neck injury. Teeth or dentures that are not aligned well. In some cases, the cause of TMJ syndrome may not be known. What are the signs or symptoms? The most common symptom of this condition is aching pain on the side of the head in the area of the TMJ. Other symptoms may include: Pain when moving your jaw, such as when chewing or biting. Not being able to open your jaw all the way. Making a clicking sound when you open your mouth. Headache. Earache. Neck or shoulder pain. How is this diagnosed? This condition may be diagnosed based on: Your symptoms and medical history. A physical exam. Your health care provider may check the range of motion of your jaw. Imaging tests, such as X-rays or an MRI. You may also need to see your dentist, who will check if your teeth and jaw are lined up correctly. How is this treated? TMJ syndrome often goes away on its own. If treatment is needed, it may include: Eating soft foods and applying ice or heat. Medicines to relieve pain or inflammation. Medicines or massage to relax the muscles. A splint, bite plate, or mouthpiece to prevent teeth grinding or jaw clenching. Relaxation techniques or counseling to help reduce stress. A therapy for pain in which an electrical current is applied to the nerves through the skin  (transcutaneous electrical nerve stimulation). Acupuncture. This may help to relieve pain. Jaw surgery. This is rarely needed. Follow these instructions at home:  Eating and drinking Eat a soft diet if you are having trouble chewing. Avoid foods that require a lot of chewing. Do not chew gum. General instructions Take over-the-counter and prescription medicines only as told by your health care provider. If directed, put ice on the painful area. To do this: Put ice in a plastic bag. Place a towel between your skin and the bag. Leave the ice on for 20 minutes, 2-3 times a day. Remove the ice if your skin turns bright red. This is very important. If you cannot feel pain, heat, or cold, you have a greater risk of damage to the area. Apply a warm, wet cloth (warm compress) to the painful area as told. Massage your jaw area and do any jaw stretching exercises as told by your health care provider. If you were given a splint, bite plate, or mouthpiece, wear it as told by your health care provider. Keep all follow-up visits. This is important. Where to find more information [employer] of Dental and Craniofacial Research: [employer] Contact a health care provider if: You have trouble eating. You have new or worsening symptoms. Get help right away if: Your jaw locks. Summary Temporomandibular joint syndrome (TMJ syndrome) is a condition that causes pain in the temporomandibular joints. These joints are located near your ears and allow your jaw to open and close. TMJ syndrome is often mild and goes away within  a few weeks. However, sometimes the condition becomes a long-term (chronic) problem. Symptoms include an aching pain on the side of the head in the area of the TMJ, pain when chewing or biting, and being unable to open your jaw all the way. You may also make a clicking sound when you open your mouth. TMJ syndrome often goes away on its own. If treatment is needed, it may  include medicines to relieve pain, reduce inflammation, or relax the muscles. A splint, bite plate, or mouthpiece may also be used to prevent teeth grinding or jaw clenching. This information is not intended to replace advice given to you by your health care provider. Make sure you discuss any questions you have with your health care provider. Document Revised: 12/18/2020 Document Reviewed: 12/18/2020 Elsevier Patient Education  2024 [employer] Jaw Range of Motion Exercises Jaw range of motion exercises are exercises that help your jaw move better. Exercises that help you have good posture (postural exercises) also help relieve jaw discomfort. These are often done along with range of motion exercises. These exercises can help prevent or improve: Trouble opening your mouth. Pain in your jaw while it is open or closed. Temporomandibular joint (TMJ) pain. Headache caused by jaw tension. Take other actions to prevent or relieve jaw pain, such as: Avoiding things that cause or increase jaw pain. This may include: Chewing gum or eating hard foods. Clenching your jaw or teeth, grinding your teeth, or keeping tension in your jaw muscles. Opening your mouth wide, such as for a big yawn. Leaning on your jaw, such as resting your jaw in your hand while leaning on a desk. Putting ice on your jaw. To do this: Put ice in a plastic bag. Place a towel between your skin and the bag. Leave the ice on for 20 minutes, 2-3 times a day. Remove the ice if your skin turns bright red. This is very important. If you cannot feel pain, heat, or cold, you have a greater risk of damage to the area. Only do jaw exercises that your health care provider recommends. Only move your jaw as far as it can comfortably go in each direction. Do not move your jaw into positions that cause pain. Range of motion exercises Repeat each of these exercises 8 times, 1-2 times a day, or as told by your health care provider. Exercise A:  Forward protrusion  Push your jaw forward. Hold this position for 1-2 seconds. Let your jaw return to its normal position and rest it there for 1-2 seconds. Exercise B: Controlled opening  Stand or sit in front of a mirror. Place your tongue on the roof of your mouth, just behind your top teeth. Keeping your tongue on the roof of your mouth, slowly open and close your mouth. While you open and close your mouth, watch your jaw in the mirror. Try to keep your jaw from moving to one side or the other. Exercise C: Right and left motion  Move your jaw right. Hold this position for 1-2 seconds. Let your jaw return to its normal position, and rest it there for 1-2 seconds. Move your jaw left. Hold this position for 1-2 seconds. Let your jaw return to its normal position, and rest it there for 1-2 seconds. Postural exercises Exercise A: Chin tucks  You can do this exercise sitting, standing, or lying down. Move your head straight back, keeping your head level. You can guide the movement by placing your fingers on your chin to  push your jaw back in an even motion. You should be able to feel a double chin form at the end of the motion. Hold this position for 5 seconds. Repeat 10-15 times. Exercise B: Shoulder blade squeeze  Sit or stand. Bend your elbows to about 90 degrees, which is the shape of a capital letter L. Keep your upper arms by your body. Squeeze your shoulder blades down and back, as though you were trying to touch your elbows behind you. Do not shrug your shoulders or move your head. Hold this position for 5 seconds. Repeat 10-15 times. Exercise C: Chest stretch  Stand in a doorway with one of your feet slightly in front of the other. This is called a staggered stance. If you cannot reach your forearms to the door frame, do this exercise in a corner of a room. Put both of your hands and your forearms on the door frame, with your arms wide apart. Make sure your arms are at a  90-degree angle to your body. Place your hands on the door frame at the height of your elbows. Slowly move your weight onto your front foot until you feel a stretch across your chest and in the front of your shoulders. Keep your head and chest upright and keep your abdominal muscles tight. Do not lean in. Hold this position for 30 seconds. Repeat 3 times. Contact a health care provider if: You have jaw pain that is new or gets worse. You have clicking or popping sounds while doing the exercises. Get help right away if: Your jaw is stuck in one place and you cannot move it. You cannot open or close your mouth. Summary Jaw range of motion exercises are exercises that help your jaw move better. Take actions to prevent or relieve jaw pain: limit chewing gum or eating hard foods; clenching your jaw or teeth; or leaning on your jaw, such as resting your jaw in your hand while leaning on a desk. Repeat each of the jaw range of motion exercises 8 times, 1-2 times a day, or as told by your health care provider. Contact a health care provider if you have clicking or popping sounds while doing the exercises. This information is not intended to replace advice given to you by your health care provider. Make sure you discuss any questions you have with your health care provider. Document Revised: 12/18/2020 Document Reviewed: 12/18/2020 Elsevier Patient Education  2024 [employer].

## 2023-11-15 NOTE — Progress Notes (Signed)
 Subjective: CC: Jaw pain PCP: Jolinda Norene HERO, DO Ann Bolton is a 78 y.o. female presenting to clinic today for:  1.  Jaw pain Fosamax  started in 2021 by previous PCP.  Patient reports today that she has had some jaw pain and is worried that it is related to chronic use of Fosamax .  She notes that she particularly has some irritation on the left side.  Both sides pop however.  She had an ear infection in September and that is when that left-sided jaw pain seem to start.  She does not have any false teeth but did get some dental work recently where she had some crowns replaced.  Does not grind or clench her teeth to her knowledge.  Had orthopantogram's done by her dentist in the last year and those were normal  2.  Left bicep pain She notes that she still has a little bit in the shoulder of pain but it is mostly in the actual bicep belly.  She reports sometimes it is hard to lift things.  She reports no sensory changes.  ROS: Per HPI  No Known Allergies Past Medical History:  Diagnosis Date   Allergy    Arthritis    DDD (degenerative disc disease), lumbar    Hyperlipidemia    Hypertension    Osteoporosis     Current Outpatient Medications:    alendronate  (FOSAMAX ) 70 MG tablet, Take 1 tablet (70 mg total) by mouth once a week. Take with a full glass of water on an empty stomach., Disp: 12 tablet, Rfl: 3   b complex vitamins capsule, Take 1 capsule by mouth daily., Disp: , Rfl:    Calcium 600-200 MG-UNIT tablet, Take 1 tablet by mouth daily., Disp: , Rfl:    cholecalciferol (VITAMIN D ) 1000 units tablet, Take 1,000 Units by mouth daily., Disp: , Rfl:    LIDO-MENTHOL-METHYL SAL-CAMPH EX, Apply topically., Disp: , Rfl:    lisinopril  (ZESTRIL ) 40 MG tablet, Take 1 tablet (40 mg total) by mouth daily. STOP amlodipine . *New dose, Disp: 100 tablet, Rfl: 3   multivitamin-iron-minerals-folic acid (CENTRUM) chewable tablet, Chew 1 tablet by mouth daily., Disp: , Rfl:     oxybutynin  (DITROPAN -XL) 5 MG 24 hr tablet, Take 1 tablet (5 mg total) by mouth at bedtime., Disp: 100 tablet, Rfl: 3   pravastatin  (PRAVACHOL ) 40 MG tablet, Take 1 tablet (40 mg total) by mouth daily., Disp: 100 tablet, Rfl: 3   XIIDRA 5 % SOLN, Place 1 drop into both eyes 2 (two) times daily. , Disp: , Rfl:  Social History   Socioeconomic History   Marital status: Widowed    Spouse name: Not on file   Number of children: 3   Years of education: Not on file   Highest education level: Bachelor's degree (e.g., BA, AB, BS)  Occupational History   Occupation: retired    Comment: banking   Tobacco Use   Smoking status: Never   Smokeless tobacco: Never  Vaping Use   Vaping status: Never Used  Substance and Sexual Activity   Alcohol use: Yes    Alcohol/week: 2.0 standard drinks of alcohol    Types: 2 Glasses of wine per week    Comment: Red wine   Drug use: No   Sexual activity: Not Currently  Other Topics Concern   Not on file  Social History Narrative   She lives alone - one level. 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   Social Drivers of Corporate investment banker Strain: Low Risk  (11/05/2023)   Overall Financial Resource Strain (CARDIA)    Difficulty of Paying Living Expenses: Not hard at all  Food Insecurity: No Food Insecurity (11/05/2023)   Hunger Vital Sign    Worried About Running Out of Food in the Last Year: Never true    Ran Out of Food in the Last Year: Never true  Transportation Needs: No Transportation Needs (11/05/2023)   PRAPARE - Administrator, Civil Service (Medical): No    Lack of Transportation (Non-Medical): No  Physical Activity: Sufficiently Active (11/05/2023)   Exercise Vital Sign    Days of Exercise per Week: 4 days    Minutes of Exercise per Session: 40 min  Stress: No Stress Concern Present (11/05/2023)   [employer] of Occupational Health - Occupational Stress Questionnaire    Feeling of Stress: Only a little  Social Connections: Moderately Isolated (11/05/2023)   Social Connection and Isolation Panel    Frequency of Communication with Friends and Family: More than three times a week    Frequency of Social Gatherings with Friends and Family: Three times a week    Attends Religious Services: Never    Active Member of Clubs or Organizations: Yes    Attends Banker Meetings: More than 4 times per year    Marital Status: Widowed  Intimate Partner Violence: Not At Risk (11/08/2023)   Humiliation, Afraid, Rape, and Kick questionnaire    Fear of Current or Ex-Partner: No    Emotionally Abused: No    Physically Abused: No    Sexually Abused: No   Family History  Problem Relation Age of Onset   Cancer Mother        Brain and lung    Hypertension Mother    Heart disease Father    Other Brother        died in war    Allergies Daughter    Menstrual problems Daughter    Breast cancer Daughter    Cancer Daughter    Allergies Son    Heart disease Maternal Grandmother    Leukemia Maternal Grandfather    Dementia Paternal Grandmother    Other Paternal Grandfather        diptheria    Mental illness Brother    Obesity Brother    Allergies Son     Objective: Office vital signs reviewed. BP 130/79   Pulse 77   Temp 97.9 F (36.6 C)   Ht 5' 1 (1.549 m)   Wt 127 lb 3.2 oz (57.7 kg)   SpO2 (!) 76%   BMI 24.03 kg/m   Physical Examination:  General: Awake, alert, well appearing female, No acute distress HEENT:   Jaw: Asymmetric glide of jaw to the right.  She has palpable crepitus on the left TMJ.  She is able to open her mouth at least 3 finger breaths MSK: No gross deformity of the left bicep.  She is able to move the upper extremity fully without difficulty  Assessment/ Plan: 78 y.o. female   Bilateral temporomandibular joint pain  Age-related osteoporosis without  current pathological fracture  Tendinopathy of left biceps tendon  Home care instructions were reviewed for TMJ.  Discussed use of heat/ice.  Jaw exercises provided.  If needed, can place referral and/or add muscle relaxant  I do not think that the pain she is  experiencing has anything to do with her bisphosphonate.  She still has 1 more year on the medication before needing to discontinue for drug holiday  Suspect bicep tendinopathy and I am glad to refer to sports medicine should she desire going forward  Norene CHRISTELLA Fielding, DO [facility] [phone]

## 2023-12-12 ENCOUNTER — Other Ambulatory Visit: Payer: Self-pay | Admitting: Family Medicine

## 2023-12-12 DIAGNOSIS — M810 Age-related osteoporosis without current pathological fracture: Secondary | ICD-10-CM

## 2024-01-15 ENCOUNTER — Other Ambulatory Visit: Payer: Self-pay | Admitting: Family Medicine

## 2024-01-15 DIAGNOSIS — I10 Essential (primary) hypertension: Secondary | ICD-10-CM

## 2024-01-28 ENCOUNTER — Encounter: Payer: Self-pay | Admitting: Family Medicine

## 2024-02-20 ENCOUNTER — Other Ambulatory Visit: Payer: Self-pay | Admitting: Family Medicine

## 2024-02-20 DIAGNOSIS — N3946 Mixed incontinence: Secondary | ICD-10-CM

## 2024-02-28 ENCOUNTER — Other Ambulatory Visit: Payer: Self-pay | Admitting: Family Medicine

## 2024-02-28 ENCOUNTER — Ambulatory Visit: Payer: Medicare Other | Admitting: Family Medicine

## 2024-02-28 ENCOUNTER — Ambulatory Visit: Payer: Medicare Other

## 2024-02-28 ENCOUNTER — Encounter: Payer: Self-pay | Admitting: Family Medicine

## 2024-02-28 VITALS — BP 137/76 | HR 77 | Temp 97.4°F | Ht 61.0 in | Wt 124.2 lb

## 2024-02-28 DIAGNOSIS — Z0001 Encounter for general adult medical examination with abnormal findings: Secondary | ICD-10-CM

## 2024-02-28 DIAGNOSIS — Z780 Asymptomatic menopausal state: Secondary | ICD-10-CM

## 2024-02-28 DIAGNOSIS — M2550 Pain in unspecified joint: Secondary | ICD-10-CM

## 2024-02-28 DIAGNOSIS — M810 Age-related osteoporosis without current pathological fracture: Secondary | ICD-10-CM | POA: Diagnosis not present

## 2024-02-28 DIAGNOSIS — Z0000 Encounter for general adult medical examination without abnormal findings: Secondary | ICD-10-CM

## 2024-02-28 DIAGNOSIS — L309 Dermatitis, unspecified: Secondary | ICD-10-CM

## 2024-02-28 DIAGNOSIS — Z13820 Encounter for screening for osteoporosis: Secondary | ICD-10-CM

## 2024-02-28 DIAGNOSIS — E7800 Pure hypercholesterolemia, unspecified: Secondary | ICD-10-CM | POA: Diagnosis not present

## 2024-02-28 DIAGNOSIS — I10 Essential (primary) hypertension: Secondary | ICD-10-CM | POA: Diagnosis not present

## 2024-02-28 DIAGNOSIS — D692 Other nonthrombocytopenic purpura: Secondary | ICD-10-CM | POA: Diagnosis not present

## 2024-02-28 LAB — LIPID PANEL

## 2024-02-28 MED ORDER — ALENDRONATE SODIUM 70 MG PO TABS: 70.0000 mg | ORAL_TABLET | ORAL | 4 refills | Status: AC

## 2024-02-28 MED ORDER — LISINOPRIL 40 MG PO TABS: 40.0000 mg | ORAL_TABLET | Freq: Every day | ORAL | 3 refills | Status: AC

## 2024-02-28 MED ORDER — PRAVASTATIN SODIUM 40 MG PO TABS
40.0000 mg | ORAL_TABLET | Freq: Every day | ORAL | 3 refills | Status: AC
Start: 2024-02-28 — End: ?

## 2024-02-28 NOTE — Progress Notes (Signed)
 Ann Bolton is a 78 y.o. female presents to office today for annual physical exam examination.    She reports that overall she has been doing well.  Continues to have some intermittent right sided TMJ pain but overall that has been controlled.  Her biggest concern today is that she has been having polyarthralgia and polymyalgia.  She has had quite a bit of arthritic changes throughout the years and has been utilizing Exer strength Tylenol but some days she just feels like it is really bad.  She is on a lifting restriction of the left upper extremity due to the tendinitis she has and that when.  She does report some pain and difficulty with overhead movements.  Sometimes she gets some mid low back pain as well.  She remains very physically active and works out at least twice per week.  No known family history of autoimmune disease including polymyositis, SLE or rheumatoid arthritis.  She denies any gross joint swelling or erythema.  She is compliant with Pravachol , lisinopril  and Fosamax .  The Ditropan  caused dysuria so she discontinued it and is doing an AZO bladder control OTC med that seems to be working for the most part  She also reports a little spot on her right knuckle that was sore but seems to be getting a little better.  Occupation: retired, Substance use: none Health Maintenance Due  Topic Date Due   DEXA SCAN  11/09/2023    Immunization History  Administered Date(s) Administered   Fluad Quad(high Dose 65+) 01/24/2019, 02/17/2020, 02/17/2021, 02/19/2022   Fluad Trivalent(High Dose 65+) 02/28/2023   INFLUENZA, HIGH DOSE SEASONAL PF 03/09/2016, 02/25/2018   Influenza-Unspecified 02/25/2017, 01/27/2019, 02/06/2024   Moderna Covid-19 Fall Seasonal Vaccine 22yrs & older 02/06/2024   Moderna Covid-19 Vaccine Bivalent Booster 52yrs & up 05/30/2021   Moderna SARS-COV2 Booster Vaccination 03/16/2020   Moderna Sars-Covid-2 Vaccination 06/11/2019, 07/17/2019, 11/08/2020, 03/06/2023    Pneumococcal Conjugate-13 03/09/2016   Pneumococcal Polysaccharide-23 03/31/2018   Tdap 02/17/2019   Zoster Recombinant(Shingrix) 01/23/2017, 04/30/2017   Past Medical History:  Diagnosis Date   Allergy    Arthritis    DDD (degenerative disc disease), lumbar    Hyperlipidemia    Hypertension    Osteoporosis    Social History   Socioeconomic History   Marital status: Widowed    Spouse name: Not on file   Number of children: 3   Years of education: Not on file   Highest education level: Bachelor's degree (e.g., BA, AB, BS)  Occupational History   Occupation: retired    Comment: banking   Tobacco Use   Smoking status: Never   Smokeless tobacco: Never  Vaping Use   Vaping status: Never Used  Substance and Sexual Activity   Alcohol use: Yes    Alcohol/week: 2.0 standard drinks of alcohol    Types: 2 Glasses of wine per week    Comment: Red wine   Drug use: No   Sexual activity: Not Currently  Other Topics Concern   Not on file  Social History Narrative   She lives alone - one level. 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   Social Drivers of Health   Financial Resource Strain: Low Risk  (11/05/2023)   Overall Financial Resource Strain (CARDIA)    Difficulty of Paying Living Expenses: Not hard at all  Food Insecurity: No Food Insecurity (11/05/2023)  Hunger Vital Sign    Worried About Running Out of Food in the Last Year: Never true    Ran Out of Food in the Last Year: Never true  Transportation Needs: No Transportation Needs (11/05/2023)   PRAPARE - Administrator, Civil Service (Medical): No    Lack of Transportation (Non-Medical): No  Physical Activity: Sufficiently Active (11/05/2023)   Exercise Vital Sign    Days of Exercise per Week: 4 days    Minutes of Exercise per Session: 40 min  Stress: No Stress Concern Present (11/05/2023)    [employer] of Occupational Health - Occupational Stress Questionnaire    Feeling of Stress: Only a little  Social Connections: Moderately Isolated (11/05/2023)   Social Connection and Isolation Panel    Frequency of Communication with Friends and Family: More than three times a week    Frequency of Social Gatherings with Friends and Family: Three times a week    Attends Religious Services: Never    Active Member of Clubs or Organizations: Yes    Attends Banker Meetings: More than 4 times per year    Marital Status: Widowed  Intimate Partner Violence: Not At Risk (11/08/2023)   Humiliation, Afraid, Rape, and Kick questionnaire    Fear of Current or Ex-Partner: No    Emotionally Abused: No    Physically Abused: No    Sexually Abused: No   Past Surgical History:  Procedure Laterality Date   CATARACT EXTRACTION, BILATERAL     Family History  Problem Relation Age of Onset   Cancer Mother        Brain and lung    Hypertension Mother    Heart disease Father    Other Brother        died in war    Allergies Daughter    Menstrual problems Daughter    Breast cancer Daughter    Cancer Daughter    Allergies Son    Heart disease Maternal Grandmother    Leukemia Maternal Grandfather    Dementia Paternal Grandmother    Other Paternal Grandfather        diptheria    Mental illness Brother    Obesity Brother    Allergies Son     Current Outpatient Medications:    alendronate  (FOSAMAX ) 70 MG tablet, TAKE 1 TABLET BY MOUTH WEEKLY  WITH 8 OZ OF PLAIN WATER 30  MINUTES BEFORE FIRST FOOD, DRINK OR MEDS. STAY UPRIGHT FOR 30  MINS, Disp: 12 tablet, Rfl: 0   b complex vitamins capsule, Take 1 capsule by mouth daily., Disp: , Rfl:    Calcium 600-200 MG-UNIT tablet, Take 1 tablet by mouth daily., Disp: , Rfl:    cholecalciferol (VITAMIN D ) 1000 units tablet, Take 1,000 Units by mouth daily., Disp: , Rfl:    lisinopril  (ZESTRIL ) 40 MG tablet, TAKE 1 TABLET BY MOUTH DAILY, Disp:  100 tablet, Rfl: 1   multivitamin-iron-minerals-folic acid (CENTRUM) chewable tablet, Chew 1 tablet by mouth daily., Disp: , Rfl:    pravastatin  (PRAVACHOL ) 40 MG tablet, Take 1 tablet (40 mg total) by mouth daily., Disp: 100 tablet, Rfl: 3   LIDO-MENTHOL-METHYL SAL-CAMPH EX, Apply topically. (Patient not taking: Reported on 02/28/2024), Disp: , Rfl:    oxybutynin  (DITROPAN -XL) 5 MG 24 hr tablet, TAKE 1 TABLET BY MOUTH AT  BEDTIME (Patient not taking: Reported on 02/28/2024), Disp: 100 tablet, Rfl: 0   XIIDRA 5 % SOLN, Place 1 drop into both eyes 2 (two) times daily. ,  Disp: , Rfl:   No Known Allergies   ROS: Review of Systems Pertinent items noted in HPI and remainder of comprehensive ROS otherwise negative.    Physical exam BP 137/76   Pulse 77   Temp (!) 97.4 F (36.3 C)   Ht 5' 1 (1.549 m)   Wt 124 lb 4 oz (56.4 kg)   SpO2 98%   BMI 23.48 kg/m  General appearance: alert, cooperative, appears stated age, and no distress Head: Normocephalic, without obvious abnormality, atraumatic Eyes: negative findings: lids and lashes normal, conjunctivae and sclerae normal, corneas clear, and pupils equal, round, reactive to light and accomodation Ears: normal TM's and external ear canals both ears Nose: Nares normal. Septum midline. Mucosa normal. No drainage or sinus tenderness. Throat: lips, mucosa, and tongue normal; teeth and gums normal Neck: no adenopathy, no carotid bruit, supple, symmetrical, trachea midline, and thyroid  not enlarged, symmetric, no tenderness/mass/nodules Back: Some flattening at the lumbosacral curve but otherwise no significant palpable abnormalities.  She is ambulating independently with normal gait and station Lungs: clear to auscultation bilaterally Heart: regular rate and rhythm, S1, S2 normal, no murmur, click, rub or gallop Abdomen: soft, non-tender; bowel sounds normal; no masses,  no organomegaly Extremities: extremities normal, atraumatic, no cyanosis or  edema Pulses: 2+ and symmetric Skin: Multiple senile letingo and solar lentigo.  She does have a slightly hyperkeratotic, cystic like lesion over the dorsal aspect of the third MCP Lymph nodes: Cervical, supraclavicular normal Neurologic: Grossly normal      11/15/2023    2:46 PM 11/08/2023   10:13 AM 02/28/2023    9:04 AM  Depression screen PHQ 2/9  Decreased Interest 0 0 0  Down, Depressed, Hopeless 0 0 0  PHQ - 2 Score 0 0 0  Altered sleeping 0 0 0  Tired, decreased energy 0 0 0  Change in appetite 0 0 0  Feeling bad or failure about yourself  0 0 0  Trouble concentrating 0 0 0  Moving slowly or fidgety/restless 0 0 0  Suicidal thoughts 0 0 0  PHQ-9 Score 0 0 0  Difficult doing work/chores Not difficult at all Not difficult at all Not difficult at all      11/15/2023    2:46 PM 02/28/2023    9:04 AM 04/20/2022    3:57 PM 02/19/2022   11:51 AM  GAD 7 : Generalized Anxiety Score  Nervous, Anxious, on Edge 0 0 0 0  Control/stop worrying 0 0 0 0  Worry too much - different things 0 0 0 0  Trouble relaxing 0 0 0 0  Restless 0 0 0 0  Easily annoyed or irritable 0 0 0 0  Afraid - awful might happen 0 0 0 0  Total GAD 7 Score 0 0 0 0  Anxiety Difficulty  Not difficult at all Not difficult at all Not difficult at all    No results found for this or any previous visit (from the past 2160 hours).   Assessment/ Plan: Ann Bolton here for annual physical exam.   Annual physical exam  Polyarthralgia - Plan: C-reactive protein, Sedimentation Rate, ANA w/Reflex if Positive, Rheumatoid factor, CK  Essential hypertension - Plan: CMP14+EGFR, lisinopril  (ZESTRIL ) 40 MG tablet  Pure hypercholesterolemia - Plan: CMP14+EGFR, Lipid Panel, TSH, pravastatin  (PRAVACHOL ) 40 MG tablet  Age-related osteoporosis without current pathological fracture - Plan: CMP14+EGFR, VITAMIN D  25 Hydroxy (Vit-D Deficiency, Fractures), alendronate  (FOSAMAX ) 70 MG tablet  Purpura senilis - Plan: CBC with  Differential  Dermatitis   Will evaluate for autoimmune pathology though I suspect degenerative changes given her very physical work history.  Okay to continue Tylenol for now.  May consider addition of NSAID if needed  Blood pressure was controlled upon recheck.  No changes  Nonfasting labs collected.  Continue statin  Continue Fosamax .  She is due for DEXA scan this has been ordered  Check CBC.  Okay to use OTC cortisone cream on the spot on the dorsal aspect of the right hand.  She will contact me if it is worsening or developing any other concerning symptoms  Counseled on healthy lifestyle choices, including diet (rich in fruits, vegetables and lean meats and low in salt and simple carbohydrates) and exercise (at least 30 minutes of moderate physical activity daily).  Patient to follow up 1 year for CPE  Tris Howell M. Jolinda, DO

## 2024-02-29 LAB — CMP14+EGFR
ALT: 7 IU/L (ref 0–32)
AST: 25 IU/L (ref 0–40)
Albumin: 4.5 g/dL (ref 3.8–4.8)
Alkaline Phosphatase: 73 IU/L (ref 49–135)
BUN/Creatinine Ratio: 21 (ref 12–28)
BUN: 16 mg/dL (ref 8–27)
Bilirubin Total: 0.6 mg/dL (ref 0.0–1.2)
CO2: 25 mmol/L (ref 20–29)
Calcium: 9.7 mg/dL (ref 8.7–10.3)
Chloride: 102 mmol/L (ref 96–106)
Creatinine, Ser: 0.75 mg/dL (ref 0.57–1.00)
Globulin, Total: 2.2 g/dL (ref 1.5–4.5)
Glucose: 89 mg/dL (ref 70–99)
Potassium: 4.7 mmol/L (ref 3.5–5.2)
Sodium: 140 mmol/L (ref 134–144)
Total Protein: 6.7 g/dL (ref 6.0–8.5)
eGFR: 80 mL/min/1.73 (ref >59)

## 2024-02-29 LAB — CBC WITH DIFFERENTIAL/PLATELET
Basophils Absolute: 0 x10E3/uL (ref 0.0–0.2)
Basos: 1 %
EOS (ABSOLUTE): 0 x10E3/uL (ref 0.0–0.4)
Eos: 1 %
Hematocrit: 44.7 % (ref 34.0–46.6)
Hemoglobin: 15.2 g/dL (ref 11.1–15.9)
Immature Grans (Abs): 0 x10E3/uL (ref 0.0–0.1)
Immature Granulocytes: 0 %
Lymphocytes Absolute: 1.9 x10E3/uL (ref 0.7–3.1)
Lymphs: 31 %
MCH: 32.6 pg (ref 26.6–33.0)
MCHC: 34 g/dL (ref 31.5–35.7)
MCV: 96 fL (ref 79–97)
Monocytes Absolute: 0.5 x10E3/uL (ref 0.1–0.9)
Monocytes: 8 %
Neutrophils Absolute: 3.7 x10E3/uL (ref 1.4–7.0)
Neutrophils: 59 %
Platelets: 246 x10E3/uL (ref 150–450)
RBC: 4.66 x10E6/uL (ref 3.77–5.28)
RDW: 12.7 % (ref 11.7–15.4)
WBC: 6.1 x10E3/uL (ref 3.4–10.8)

## 2024-02-29 LAB — SEDIMENTATION RATE: Sed Rate: 2 mm/h (ref 0–40)

## 2024-02-29 LAB — C-REACTIVE PROTEIN: CRP: <1 mg/L (ref 0–10)

## 2024-02-29 LAB — VITAMIN D 25 HYDROXY (VIT D DEFICIENCY, FRACTURES): Vit D, 25-Hydroxy: 59.7 ng/mL (ref 30.0–100.0)

## 2024-02-29 LAB — LIPID PANEL
Chol/HDL Ratio: 2.9 ratio (ref 0.0–4.4)
Cholesterol, Total: 180 mg/dL (ref 100–199)
HDL: 63 mg/dL (ref >39)
LDL Chol Calc (NIH): 99 mg/dL (ref 0–99)
Triglycerides: 100 mg/dL (ref 0–149)
VLDL Cholesterol Cal: 18 mg/dL (ref 5–40)

## 2024-02-29 LAB — CK: Total CK: 72 U/L (ref 32–182)

## 2024-02-29 LAB — TSH: TSH: 5.38 u[IU]/mL — ABNORMAL HIGH (ref 0.450–4.500)

## 2024-02-29 LAB — ANA W/REFLEX IF POSITIVE: Anti Nuclear Antibody (ANA): NEGATIVE

## 2024-02-29 LAB — RHEUMATOID FACTOR: Rheumatoid fact SerPl-aCnc: <10 [IU]/mL (ref <14.0)

## 2024-03-02 ENCOUNTER — Ambulatory Visit: Payer: Self-pay | Admitting: Family Medicine

## 2024-03-04 ENCOUNTER — Ambulatory Visit (INDEPENDENT_AMBULATORY_CARE_PROVIDER_SITE_OTHER)

## 2024-03-04 DIAGNOSIS — Z13820 Encounter for screening for osteoporosis: Secondary | ICD-10-CM | POA: Diagnosis not present

## 2024-03-04 DIAGNOSIS — Z780 Asymptomatic menopausal state: Secondary | ICD-10-CM | POA: Diagnosis not present

## 2024-03-10 DIAGNOSIS — M8589 Other specified disorders of bone density and structure, multiple sites: Secondary | ICD-10-CM | POA: Diagnosis not present

## 2024-03-10 DIAGNOSIS — Z780 Asymptomatic menopausal state: Secondary | ICD-10-CM | POA: Diagnosis not present

## 2024-03-11 ENCOUNTER — Ambulatory Visit: Payer: Self-pay | Admitting: Family Medicine

## 2024-06-26 ENCOUNTER — Other Ambulatory Visit: Payer: Self-pay | Admitting: Family Medicine

## 2024-06-26 DIAGNOSIS — Z1231 Encounter for screening mammogram for malignant neoplasm of breast: Secondary | ICD-10-CM

## 2024-06-29 ENCOUNTER — Inpatient Hospital Stay: Admission: RE | Admit: 2024-06-29 | Source: Ambulatory Visit

## 2024-11-09 ENCOUNTER — Ambulatory Visit

## 2024-11-10 ENCOUNTER — Ambulatory Visit: Payer: Self-pay

## 2025-03-02 ENCOUNTER — Encounter: Payer: Self-pay | Admitting: Family Medicine

## 2025-03-02 ENCOUNTER — Other Ambulatory Visit
